# Patient Record
Sex: Female | Born: 1935 | Race: White | Hispanic: No | Smoking: Never smoker
Health system: Southern US, Community
[De-identification: ages and names within clinical notes are randomized; demographics above are authoritative.]

## PROBLEM LIST (undated history)

## (undated) DIAGNOSIS — C801 Malignant (primary) neoplasm, unspecified: Secondary | ICD-10-CM

## (undated) DIAGNOSIS — I10 Essential (primary) hypertension: Secondary | ICD-10-CM

## (undated) HISTORY — PX: CHOLECYSTECTOMY: SHX55

## (undated) HISTORY — PX: CORONARY ARTERY BYPASS GRAFT: SHX141

## (undated) HISTORY — PX: TONSILLECTOMY: SUR1361

## (undated) HISTORY — PX: EYE SURGERY: SHX253

## (undated) HISTORY — PX: THYROID SURGERY: SHX805

---

## 1999-06-04 ENCOUNTER — Inpatient Hospital Stay (HOSPITAL_COMMUNITY): Admission: RE | Admit: 1999-06-04 | Discharge: 1999-06-11 | Payer: Self-pay | Admitting: Cardiology

## 1999-06-05 ENCOUNTER — Encounter: Payer: Self-pay | Admitting: Cardiothoracic Surgery

## 1999-06-06 ENCOUNTER — Encounter: Payer: Self-pay | Admitting: Cardiothoracic Surgery

## 1999-06-07 ENCOUNTER — Encounter: Payer: Self-pay | Admitting: Cardiothoracic Surgery

## 1999-06-08 ENCOUNTER — Encounter: Payer: Self-pay | Admitting: Cardiothoracic Surgery

## 2000-08-06 ENCOUNTER — Ambulatory Visit (HOSPITAL_COMMUNITY): Admission: RE | Admit: 2000-08-06 | Discharge: 2000-08-06 | Payer: Self-pay | Admitting: Cardiology

## 2000-08-06 ENCOUNTER — Encounter: Payer: Self-pay | Admitting: Cardiology

## 2000-12-30 ENCOUNTER — Ambulatory Visit (HOSPITAL_COMMUNITY): Admission: RE | Admit: 2000-12-30 | Discharge: 2000-12-30 | Payer: Self-pay | Admitting: Pulmonary Disease

## 2001-01-31 ENCOUNTER — Ambulatory Visit (HOSPITAL_COMMUNITY): Admission: RE | Admit: 2001-01-31 | Discharge: 2001-01-31 | Payer: Self-pay | Admitting: Cardiology

## 2001-01-31 ENCOUNTER — Encounter: Payer: Self-pay | Admitting: Cardiology

## 2001-04-12 ENCOUNTER — Encounter: Payer: Self-pay | Admitting: *Deleted

## 2001-04-12 ENCOUNTER — Ambulatory Visit (HOSPITAL_COMMUNITY): Admission: RE | Admit: 2001-04-12 | Discharge: 2001-04-13 | Payer: Self-pay | Admitting: *Deleted

## 2001-11-11 ENCOUNTER — Encounter: Payer: Self-pay | Admitting: *Deleted

## 2001-11-11 ENCOUNTER — Ambulatory Visit (HOSPITAL_COMMUNITY): Admission: RE | Admit: 2001-11-11 | Discharge: 2001-11-12 | Payer: Self-pay | Admitting: *Deleted

## 2002-01-20 ENCOUNTER — Observation Stay (HOSPITAL_COMMUNITY): Admission: RE | Admit: 2002-01-20 | Discharge: 2002-01-21 | Payer: Self-pay | Admitting: Orthopedic Surgery

## 2002-03-15 ENCOUNTER — Encounter (HOSPITAL_COMMUNITY): Admission: RE | Admit: 2002-03-15 | Discharge: 2002-04-14 | Payer: Self-pay | Admitting: Orthopedic Surgery

## 2002-04-17 ENCOUNTER — Encounter (HOSPITAL_COMMUNITY): Admission: RE | Admit: 2002-04-17 | Discharge: 2002-05-17 | Payer: Self-pay | Admitting: Orthopedic Surgery

## 2002-05-19 ENCOUNTER — Encounter (HOSPITAL_COMMUNITY): Admission: RE | Admit: 2002-05-19 | Discharge: 2002-06-18 | Payer: Self-pay | Admitting: Orthopedic Surgery

## 2002-06-28 ENCOUNTER — Encounter (HOSPITAL_COMMUNITY): Admission: RE | Admit: 2002-06-28 | Discharge: 2002-07-28 | Payer: Self-pay | Admitting: Orthopedic Surgery

## 2002-07-12 ENCOUNTER — Inpatient Hospital Stay (HOSPITAL_COMMUNITY): Admission: EM | Admit: 2002-07-12 | Discharge: 2002-07-15 | Payer: Self-pay | Admitting: Internal Medicine

## 2002-07-12 ENCOUNTER — Encounter: Payer: Self-pay | Admitting: Emergency Medicine

## 2002-07-12 ENCOUNTER — Emergency Department (HOSPITAL_COMMUNITY): Admission: EM | Admit: 2002-07-12 | Discharge: 2002-07-12 | Payer: Self-pay | Admitting: Emergency Medicine

## 2002-07-13 ENCOUNTER — Encounter: Payer: Self-pay | Admitting: Internal Medicine

## 2002-07-14 ENCOUNTER — Encounter: Payer: Self-pay | Admitting: Cardiology

## 2002-08-03 ENCOUNTER — Ambulatory Visit (HOSPITAL_COMMUNITY): Admission: RE | Admit: 2002-08-03 | Discharge: 2002-08-03 | Payer: Self-pay | Admitting: Cardiology

## 2002-08-03 ENCOUNTER — Encounter: Payer: Self-pay | Admitting: Cardiology

## 2002-10-17 ENCOUNTER — Encounter: Payer: Self-pay | Admitting: Cardiology

## 2002-10-17 ENCOUNTER — Ambulatory Visit (HOSPITAL_COMMUNITY): Admission: RE | Admit: 2002-10-17 | Discharge: 2002-10-17 | Payer: Self-pay | Admitting: Cardiology

## 2003-09-14 ENCOUNTER — Observation Stay (HOSPITAL_COMMUNITY): Admission: RE | Admit: 2003-09-14 | Discharge: 2003-09-15 | Payer: Self-pay | Admitting: Orthopedic Surgery

## 2003-10-29 ENCOUNTER — Encounter (HOSPITAL_COMMUNITY): Admission: RE | Admit: 2003-10-29 | Discharge: 2003-11-28 | Payer: Self-pay | Admitting: Orthopedic Surgery

## 2003-11-29 ENCOUNTER — Encounter (HOSPITAL_COMMUNITY): Admission: RE | Admit: 2003-11-29 | Discharge: 2003-12-07 | Payer: Self-pay | Admitting: Orthopedic Surgery

## 2003-12-06 ENCOUNTER — Ambulatory Visit (HOSPITAL_COMMUNITY): Admission: RE | Admit: 2003-12-06 | Discharge: 2003-12-06 | Payer: Self-pay | Admitting: *Deleted

## 2003-12-11 ENCOUNTER — Encounter (HOSPITAL_COMMUNITY): Admission: RE | Admit: 2003-12-11 | Discharge: 2004-01-10 | Payer: Self-pay | Admitting: Orthopedic Surgery

## 2004-10-21 ENCOUNTER — Ambulatory Visit: Payer: Self-pay | Admitting: *Deleted

## 2004-10-27 ENCOUNTER — Ambulatory Visit: Payer: Self-pay | Admitting: *Deleted

## 2004-10-27 ENCOUNTER — Ambulatory Visit (HOSPITAL_COMMUNITY): Admission: RE | Admit: 2004-10-27 | Discharge: 2004-10-27 | Payer: Self-pay | Admitting: *Deleted

## 2004-11-03 ENCOUNTER — Ambulatory Visit: Payer: Self-pay | Admitting: *Deleted

## 2004-11-18 ENCOUNTER — Ambulatory Visit: Payer: Self-pay

## 2005-01-19 ENCOUNTER — Ambulatory Visit: Payer: Self-pay | Admitting: Cardiology

## 2006-01-15 ENCOUNTER — Ambulatory Visit: Payer: Self-pay | Admitting: Internal Medicine

## 2006-01-19 ENCOUNTER — Ambulatory Visit (HOSPITAL_COMMUNITY): Admission: RE | Admit: 2006-01-19 | Discharge: 2006-01-19 | Payer: Self-pay | Admitting: Internal Medicine

## 2007-01-28 ENCOUNTER — Ambulatory Visit (HOSPITAL_COMMUNITY): Admission: RE | Admit: 2007-01-28 | Discharge: 2007-01-28 | Payer: Self-pay | Admitting: Internal Medicine

## 2007-02-28 ENCOUNTER — Ambulatory Visit: Payer: Self-pay | Admitting: Internal Medicine

## 2008-02-16 ENCOUNTER — Ambulatory Visit: Payer: Self-pay | Admitting: Cardiology

## 2008-02-24 ENCOUNTER — Ambulatory Visit (HOSPITAL_COMMUNITY): Admission: RE | Admit: 2008-02-24 | Discharge: 2008-02-24 | Payer: Self-pay | Admitting: Cardiology

## 2008-05-23 ENCOUNTER — Ambulatory Visit: Payer: Self-pay | Admitting: Cardiology

## 2008-05-23 ENCOUNTER — Ambulatory Visit (HOSPITAL_COMMUNITY): Admission: RE | Admit: 2008-05-23 | Discharge: 2008-05-23 | Payer: Self-pay | Admitting: Cardiology

## 2008-06-06 ENCOUNTER — Ambulatory Visit: Payer: Self-pay | Admitting: Cardiology

## 2009-03-27 ENCOUNTER — Ambulatory Visit (HOSPITAL_COMMUNITY): Admission: RE | Admit: 2009-03-27 | Discharge: 2009-03-27 | Payer: Self-pay | Admitting: Pulmonary Disease

## 2009-04-30 ENCOUNTER — Ambulatory Visit (HOSPITAL_COMMUNITY): Admission: RE | Admit: 2009-04-30 | Discharge: 2009-04-30 | Payer: Self-pay | Admitting: Pulmonary Disease

## 2009-05-22 ENCOUNTER — Ambulatory Visit (HOSPITAL_COMMUNITY): Admission: RE | Admit: 2009-05-22 | Discharge: 2009-05-22 | Payer: Self-pay | Admitting: Pulmonary Disease

## 2009-06-04 ENCOUNTER — Telehealth (INDEPENDENT_AMBULATORY_CARE_PROVIDER_SITE_OTHER): Payer: Self-pay | Admitting: *Deleted

## 2009-06-12 ENCOUNTER — Ambulatory Visit (HOSPITAL_COMMUNITY): Admission: RE | Admit: 2009-06-12 | Discharge: 2009-06-12 | Payer: Self-pay | Admitting: Pulmonary Disease

## 2009-07-18 DIAGNOSIS — Z8669 Personal history of other diseases of the nervous system and sense organs: Secondary | ICD-10-CM

## 2009-07-18 DIAGNOSIS — I447 Left bundle-branch block, unspecified: Secondary | ICD-10-CM | POA: Insufficient documentation

## 2009-07-18 DIAGNOSIS — I2510 Atherosclerotic heart disease of native coronary artery without angina pectoris: Secondary | ICD-10-CM

## 2009-07-18 DIAGNOSIS — I10 Essential (primary) hypertension: Secondary | ICD-10-CM | POA: Insufficient documentation

## 2009-07-18 DIAGNOSIS — I739 Peripheral vascular disease, unspecified: Secondary | ICD-10-CM | POA: Insufficient documentation

## 2009-07-19 ENCOUNTER — Encounter (INDEPENDENT_AMBULATORY_CARE_PROVIDER_SITE_OTHER): Payer: Self-pay | Admitting: *Deleted

## 2009-07-19 ENCOUNTER — Ambulatory Visit: Payer: Self-pay | Admitting: Cardiology

## 2009-07-19 DIAGNOSIS — E785 Hyperlipidemia, unspecified: Secondary | ICD-10-CM

## 2009-07-19 DIAGNOSIS — Z9089 Acquired absence of other organs: Secondary | ICD-10-CM

## 2009-07-19 DIAGNOSIS — I6529 Occlusion and stenosis of unspecified carotid artery: Secondary | ICD-10-CM

## 2009-08-02 ENCOUNTER — Ambulatory Visit (HOSPITAL_COMMUNITY)
Admission: RE | Admit: 2009-08-02 | Discharge: 2009-08-02 | Payer: Self-pay | Source: Home / Self Care | Admitting: Cardiology

## 2009-08-06 ENCOUNTER — Encounter: Payer: Self-pay | Admitting: Cardiology

## 2009-11-13 ENCOUNTER — Telehealth (INDEPENDENT_AMBULATORY_CARE_PROVIDER_SITE_OTHER): Payer: Self-pay

## 2010-03-11 ENCOUNTER — Telehealth (INDEPENDENT_AMBULATORY_CARE_PROVIDER_SITE_OTHER): Payer: Self-pay

## 2010-03-30 ENCOUNTER — Encounter: Payer: Self-pay | Admitting: *Deleted

## 2010-04-08 NOTE — Progress Notes (Signed)
Summary: Colleen Roberts LIPITOR   Phone Note Call from Patient Call back at Home Phone [phone]   Caller: PT Reason for Call: Talk to Nurse Summary of Call: PT HAS QUESTIONS ABOUT GENERIC LIPITOR AND IF IT WILL WORK FOR HER. IF SO SHE WOULD LIKE TO CHANGE TO GENERIC. Initial call taken by: Faythe Ghee,  November 13, 2009 9:30 AM  Follow-up for Phone Call        Pt. advised that the generic for Lipitor is not yet available and that when it does we will refill with generic. She states she understands info. given.  Follow-up by: Larita Fife Via LPN,  November 13, 2009 10:43 AM

## 2010-04-08 NOTE — Letter (Signed)
Summary: [place] Results Engineer, agricultural at [facility]  [address] [place] [address]   Phone: [phone]  Fax: [phone]      Aug 06, 2009 MRN: [id number]   Colleen Roberts [address] [place], [place]  [address]   Dear Ms. Aurther Loft,  Your test ordered by Selena Batten has been reviewed by your physician (or physician assistant) and was found to be normal or stable. Your physician (or physician assistant) felt no changes were needed at this time.  ____ Echocardiogram  ____ Cardiac Stress Test  ____ Lab Work  __X__ Peripheral vascular study of arms, legs or neck  ____ CT scan or X-ray  ____ Lung or Breathing test  ____ Other: Please continue on current medical treatment.  Thank you.   Valera Castle, MD, F.A.C.C

## 2010-04-08 NOTE — Progress Notes (Signed)
Summary: Refills  Medications Added DIOVAN HCT 320-25 MG TABS (VALSARTAN-HYDROCHLOROTHIAZIDE) Take 1 tablet by mouth once a day AMLODIPINE BESYLATE 10 MG TABS (AMLODIPINE BESYLATE) Take one tablet by mouth daily LIPITOR 20 MG TABS (ATORVASTATIN CALCIUM) Take one tablet by mouth daily.       Phone Note Call from Patient   Caller: Patient Reason for Call: Refill Medication Summary of Call: pt needs refill for medications/she states she gets them through mail order/would like return phone call to tell which ones she needs/tg Initial call taken by: Raechel Ache [facility],  June 04, 2009 9:04 AM    New/Updated Medications: DIOVAN HCT 320-25 MG TABS (VALSARTAN-HYDROCHLOROTHIAZIDE) Take 1 tablet by mouth once a day AMLODIPINE BESYLATE 10 MG TABS (AMLODIPINE BESYLATE) Take one tablet by mouth daily LIPITOR 20 MG TABS (ATORVASTATIN CALCIUM) Take one tablet by mouth daily. Prescriptions: LIPITOR 20 MG TABS (ATORVASTATIN CALCIUM) Take one tablet by mouth daily.  #90 x 0   Entered by:   Teressa Lower RN   Authorized by:   Gaylord Shih, MD, [facility]   Signed by:   Teressa Lower RN on 06/05/2009   Method used:   Faxed to ...       [facility]order)             , [place]         Ph:        Fax: [phone]   RxID:   [id number] AMLODIPINE BESYLATE 10 MG TABS (AMLODIPINE BESYLATE) Take one tablet by mouth daily  #90 x 0   Entered by:   Teressa Lower RN   Authorized by:   Gaylord Shih, MD, [facility]   Signed by:   Teressa Lower RN on 06/05/2009   Method used:   Faxed to ...       [facility] (mail[facility]order)             , [place]         Ph:        Fax: [phone]   RxID:   [id number] DIOVAN HCT 320-25 MG TABS (VALSARTAN-HYDROCHLOROTHIAZIDE) Take 1 tablet by mouth once a day  #90 x 0   Entered by:   Teressa Lower RN   Authorized by:   Gaylord Shih, MD, [facility]   Signed by:   Teressa Lower RN on 06/05/2009   Method used:   Faxed to  ...       [facility] (mail-order)             , [place]         Ph:        Fax: [phone]   RxID:   [id number]

## 2010-04-08 NOTE — Letter (Signed)
Summary: [place] Future Lab Work Engineer, agricultural at [employer]  [address], [place] [address]   Phone: [phone]  Fax: [phone]     Jul 19, 2009 MRN: [id number]   BERLENE DIXSON [address] [place], [place]  [address]      YOUR LAB WORK IS DUE  August 19, 2009 _________________________________________  Please go to [facility], located across the street from [facility] on the second floor.  Hours are Monday - Friday 7am until 7:30pm         Saturday 8am until 12noon    _x_  DO NOT EAT OR DRINK AFTER MIDNIGHT EVENING PRIOR TO LABWORK  __ YOUR LABWORK IS NOT FASTING --YOU MAY EAT PRIOR TO LABWORK

## 2010-04-08 NOTE — Assessment & Plan Note (Signed)
Summary: ROV  Medications Added ASPIR-TRIN 325 MG TBEC (ASPIRIN) take 1`tab daily VITAMIN B-12 1000 MCG TABS (CYANOCOBALAMIN) take 1 tab daily DAILY MULTI  TABS (MULTIPLE VITAMINS-MINERALS) take 1 tab daily MAGNESIUM OXIDE 500 MG TABS (MAGNESIUM OXIDE) take 1 tab daily      Allergies Added: ! MORPHINE  Visit Type:  Follow-up Primary Provider:  Nehemiah Settle  CC:  no cardiology complaints.  History of Present Illness: Colleen Roberts returns today for evaluation management of her coronary artery disease, history of prior bypass surgery, history of peripheral vascular disease with a stent in her right leg, hypertension, hyperlipidemia.  She's having no angina or ischemic symptoms. She denies any claudication. She's had no symptoms of TIAs or mini stroke.  She has not had blood work in quite some time. She seems to be compliant with her medications. Her daughter is with her today.  She denies orthopnea, PND but has had some mild edema in the right lower extremity. She has had varicose veins since age 78 she had her legs wrapped for boils.  Current Medications (verified): 1)  Diovan Hct 320-25 Mg Tabs (Valsartan-Hydrochlorothiazide) .... Take 1 Tablet By Mouth Once A Day 2)  Amlodipine Besylate 10 Mg Tabs (Amlodipine Besylate) .... Take One Tablet By Mouth Daily 3)  Lipitor 20 Mg Tabs (Atorvastatin Calcium) .... Take One Tablet By Mouth Daily. 4)  Aspir-Trin 325 Mg Tbec (Aspirin) .... Take 1`tab Daily 5)  Vitamin B-12 1000 Mcg Tabs (Cyanocobalamin) .... Take 1 Tab Daily 6)  Daily Multi  Tabs (Multiple Vitamins-Minerals) .... Take 1 Tab Daily 7)  Magnesium Oxide 500 Mg Tabs (Magnesium Oxide) .... Take 1 Tab Daily  Allergies (verified): 1)  ! Morphine  Past History:  Past Medical History: Last updated: 07/18/2009 Current Problems:  HYPERTENSION (ICD-401.9) PVD (ICD-443.9) BUNDLE BRANCH BLOCK, LEFT (ICD-426.3) CAD (ICD-414.00) SYNCOPE, HX OF (ICD-V12.49)  Past Surgical  History: Last updated: 07/18/2009 cath coronary artery bypass graft  skin cancer cholecystectomy  Social History: Last updated: 07/18/2009 Retired  Tobacco Use - No.  Alcohol Use - no Regular Exercise - no Drug Use - no  Risk Factors: Exercise: no (07/18/2009)  Risk Factors: Smoking Status: never (07/18/2009)  Review of Systems       negative other than history of present illness  Vital Signs:  Patient profile:   77 year old female Height:      60 inches Weight:      118 pounds BMI:     23.13 Pulse rate:   90 / minute BP sitting:   141 / 63  (right arm)  Vitals Entered By: Colleen Saa, CNA (Jul 19, 2009 3:53 PM)  Physical Exam  General:  no acute distress, looks out of his stated age Head:  normocephalic and atraumatic Eyes:  PERRLA/EOM intact; conjunctiva and lids normal. Neck:  Neck supple, no JVD. No masses, thyromegaly or abnormal cervical nodes. Chest Deep [place]:  no deformities or breast masses noted Lungs:  Clear bilaterally to auscultation and percussion. Heart:  PMI nondisplaced, regular rate and rhythm, soft systolic murmur, no gallop, bilateral carotid bruits right greater than left Msk:  decreased ROM.  decreased ROM.   Pulses:  pulses normal in all 4 extremities Extremities:  trace right pedal edema.  trace right pedal edema.   Neurologic:  Alert and oriented x 3. Skin:  Intact without lesions or rashes. Psych:  Normal affect.   Problems:  Medical Problems Added: 1)  Dx of Hyperlipidemia-mixed  (ICD-272.4) 2)  Dx of Thyroidectomy,  Hx of  (ICD-V45.79) 3)  Dx of Hyperlipidemia-mixed  (ICD-272.4) 4)  Dx of Carotid Artery Stenosis, Without Infarction  (ICD-433.10)  Impression & Recommendations:  Problem # 1:  CAD (ICD-414.00) Assessment Unchanged  Her updated medication list for this problem includes:    Amlodipine Besylate 10 Mg Tabs (Amlodipine besylate) .Marland Kitchen... Take one tablet by mouth daily    Aspir-trin 325 Mg Tbec (Aspirin) .Marland Kitchen... Take 1`tab  daily  Her updated medication list for this problem includes:    Amlodipine Besylate 10 Mg Tabs (Amlodipine besylate) .Marland Kitchen... Take one tablet by mouth daily    Aspir-trin 325 Mg Tbec (Aspirin) .Marland Kitchen... Take 1`tab daily  Orders: Carotid Duplex (Carotid Duplex)Future Orders: T-Lipid Profile ([id number]) ... 08/19/2009 T-Comprehensive Metabolic Panel [id number]) ... 08/19/2009  Problem # 2:  BUNDLE BRANCH BLOCK, LEFT (ICD-426.3)  Her updated medication list for this problem includes:    Amlodipine Besylate 10 Mg Tabs (Amlodipine besylate) .Marland Kitchen... Take one tablet by mouth daily    Aspir-trin 325 Mg Tbec (Aspirin) .Marland Kitchen... Take 1`tab daily  Her updated medication list for this problem includes:    Amlodipine Besylate 10 Mg Tabs (Amlodipine besylate) .Marland Kitchen... Take one tablet by mouth daily    Aspir-trin 325 Mg Tbec (Aspirin) .Marland Kitchen... Take 1`tab daily  Problem # 3:  HYPERTENSION (ICD-401.9) Assessment: Improved  Her updated medication list for this problem includes:    Diovan Hct 320-25 Mg Tabs (Valsartan-hydrochlorothiazide) .Marland Kitchen... Take 1 tablet by mouth once a day    Amlodipine Besylate 10 Mg Tabs (Amlodipine besylate) .Marland Kitchen... Take one tablet by mouth daily    Aspir-trin 325 Mg Tbec (Aspirin) .Marland Kitchen... Take 1`tab daily  Her updated medication list for this problem includes:    Diovan Hct 320-25 Mg Tabs (Valsartan-hydrochlorothiazide) .Marland Kitchen... Take 1 tablet by mouth once a day    Amlodipine Besylate 10 Mg Tabs (Amlodipine besylate) .Marland Kitchen... Take one tablet by mouth daily    Aspir-trin 325 Mg Tbec (Aspirin) .Marland Kitchen... Take 1`tab daily  Future Orders: T-Lipid Profile ([id number]) ... 08/19/2009 T-Comprehensive Metabolic Panel [id number]) ... 08/19/2009  Problem # 4:  PVD (ICD-443.9) Assessment: Unchanged  Problem # 5:  CAROTID ARTERY STENOSIS, WITHOUT INFARCTION (ICD-433.10) Assessment: New I am concerned about a right carotid bruit. There is a soft one perhaps on the left. We'll obtain carotid Dopplers. Her  updated medication list for this problem includes:    Aspir-trin 325 Mg Tbec (Aspirin) .Marland Kitchen... Take 1`tab daily  Her updated medication list for this problem includes:    Aspir-trin 325 Mg Tbec (Aspirin) .Marland Kitchen... Take 1`tab daily  Problem # 6:  HYPERLIPIDEMIA-MIXED (ICD-272.4)  She states she has not had her blood drawn in some time for cholesterol. We'll obtain fasting lipid panel when she returns. Her updated medication list for this problem includes:    Lipitor 20 Mg Tabs (Atorvastatin calcium) .Marland Kitchen... Take one tablet by mouth daily.  Her updated medication list for this problem includes:    Lipitor 20 Mg Tabs (Atorvastatin calcium) .Marland Kitchen... Take one tablet by mouth daily.  Other Orders: Future Orders: T-TSH ([id number]) ... 08/19/2009  Patient Instructions: 1)  Your physician recommends that you schedule a follow-up appointment in: 1 year 2)  Your physician recommends that you return for lab work in: 1 month 3)  Your physician has requested that you have a carotid duplex. This test is an ultrasound of the carotid arteries in your neck. It looks at blood flow through these arteries that supply the brain with blood. Allow one hour for this exam. There  are no restrictions or special instructions.

## 2010-04-10 NOTE — Progress Notes (Signed)
**Note De-Identified Mylz Yuan Obfuscation** Summary: Refill  Medications Added LIPITOR 20 MG TABS (ATORVASTATIN CALCIUM) Take one tablet by mouth daily. LIPITOR 20 MG TABS (ATORVASTATIN CALCIUM) Take one tablet by mouth daily.       Phone Note Call from Patient   Caller: Patient Reason for Call: Talk to Nurse Summary of Call: pt would like generic for Lipitor sent to [facility] Initial call taken by: Raechel Ache [facility],  March 11, 2010 9:11 AM    New/Updated Medications: LIPITOR 20 MG TABS (ATORVASTATIN CALCIUM) Take one tablet by mouth daily. LIPITOR 20 MG TABS (ATORVASTATIN CALCIUM) Take one tablet by mouth daily. Prescriptions: LIPITOR 20 MG TABS (ATORVASTATIN CALCIUM) Take one tablet by mouth daily.  #90 x 0   Entered by:   Larita Fife Carlicia Leavens LPN   Authorized by:   Joni Reining, NP   Signed by:   Larita Fife Chey Cho LPN on 16/12/9602   Method used:   Faxed to ...       [facility] (retail)             , [place]         Ph: [phone]       Fax: [phone]   RxID:   [id number] LIPITOR 20 MG TABS (ATORVASTATIN CALCIUM) Take one tablet by mouth daily.  #90 x 0   Entered by:   Larita Fife Aerion Bagdasarian LPN   Authorized by:   Joni Reining, NP   Signed by:   Larita Fife Fortune Brannigan LPN on 32/44/0102   Method used:   Electronically to        [facility] (retail)       [address]       [place], [place]  [address]       Ph: [phone]       Fax: [phone]   RxID:   [id number]  RX faxed to [facility] in error, phar. notified.

## 2010-06-16 ENCOUNTER — Telehealth: Payer: Self-pay | Admitting: Cardiology

## 2010-06-16 MED ORDER — AMLODIPINE BESYLATE 10 MG PO TABS: 10.0000 mg | ORAL_TABLET | Freq: Every day | ORAL | Status: DC

## 2010-06-16 MED ORDER — VALSARTAN-HYDROCHLOROTHIAZIDE 320-25 MG PO TABS: 1.0000 | ORAL_TABLET | Freq: Every day | ORAL | Status: DC

## 2010-06-16 MED ORDER — ATORVASTATIN CALCIUM 20 MG PO TABS: 20.0000 mg | ORAL_TABLET | Freq: Every day | ORAL | Status: DC

## 2010-06-16 NOTE — Telephone Encounter (Signed)
Patient needs refills for Atorvastatin, Diovan and Amlodopine called to [facility]

## 2010-06-23 ENCOUNTER — Telehealth: Payer: Self-pay | Admitting: Cardiology

## 2010-06-23 NOTE — Telephone Encounter (Signed)
PT NEEDS NEW RX CALLED IN [facility]. SHE WAS TOLD LAST WEEK THAT IT HAD BEEN SENT IN BUT THE PHARMACY HAS NEVER RECEIVED IT.

## 2010-06-24 ENCOUNTER — Other Ambulatory Visit (HOSPITAL_COMMUNITY): Payer: Self-pay | Admitting: Orthopaedic Surgery

## 2010-06-24 DIAGNOSIS — S32040A Wedge compression fracture of fourth lumbar vertebra, initial encounter for closed fracture: Secondary | ICD-10-CM

## 2010-06-24 DIAGNOSIS — S32050A Wedge compression fracture of fifth lumbar vertebra, initial encounter for closed fracture: Secondary | ICD-10-CM

## 2010-06-27 ENCOUNTER — Encounter (HOSPITAL_COMMUNITY)
Admission: RE | Admit: 2010-06-27 | Discharge: 2010-06-27 | Disposition: A | Discharge status: Home / Self Care | Payer: Medicare Other | Source: Ambulatory Visit | Attending: Orthopaedic Surgery | Admitting: Orthopaedic Surgery

## 2010-06-27 ENCOUNTER — Encounter (HOSPITAL_COMMUNITY): Payer: Medicare Other

## 2010-06-27 ENCOUNTER — Encounter (HOSPITAL_COMMUNITY): Payer: Self-pay

## 2010-06-27 DIAGNOSIS — M545 Low back pain, unspecified: Secondary | ICD-10-CM | POA: Insufficient documentation

## 2010-06-27 DIAGNOSIS — R937 Abnormal findings on diagnostic imaging of other parts of musculoskeletal system: Secondary | ICD-10-CM | POA: Insufficient documentation

## 2010-06-27 DIAGNOSIS — S32040A Wedge compression fracture of fourth lumbar vertebra, initial encounter for closed fracture: Secondary | ICD-10-CM

## 2010-06-27 DIAGNOSIS — S32050A Wedge compression fracture of fifth lumbar vertebra, initial encounter for closed fracture: Secondary | ICD-10-CM

## 2010-06-27 HISTORY — DX: Malignant (primary) neoplasm, unspecified: C80.1

## 2010-06-27 MED ORDER — TECHNETIUM TC 99M MEDRONATE IV KIT
25.0000 | PACK | Freq: Once | INTRAVENOUS | Status: AC | PRN
  Administered 2010-06-27: 25 via INTRAVENOUS

## 2010-07-22 NOTE — Assessment & Plan Note (Signed)
[facility]                       [place] CARDIOLOGY OFFICE NOTE   GEORGIANNE, GRITZ                       MRN:          [id number]  DATE:05/23/2008                            DOB:          Jul 31, 1918    Ms. Slimp comes in today for followup.  Unfortunately, she has developed  a viral respiratory infection over the last week or so.  She has been  coughing quite a bit and she had a vasovagal event with syncope this  past week.  There were no associated cardiac symptoms prior to this, nor  any residual symptoms of a stroke or seizure.   She denies any fever, chills, or sweats.  She has not been coughing up  any blood.  She does cough up a little bit of yellow sputum.   She does not have a history of COPD and does not smoke.   On her last visit, we repeated carotid Dopplers, which showed increased  velocities and antegrade flow in both vertebrals.  Please refer to that  procedure note on February 24, 2008.  Her blood pressure was also  elevated on last visit at 160.   We changed her Caduet to amlodipine 10 mg per day and Lipitor 20 mg per  day.  She is on Diovan HCTZ 160/25 daily.  She takes aspirin 325 mg per  day.   She denies orthopnea, PND, or peripheral edema.  She has had no angina  or ischemic symptoms.  She denies any tachy palpitations.   Her blood pressure today is 170/68, her pulse is 70 and regular.  Weight  is 118, stable.  She looks acutely ill.  Respirations 20.  She has  audible wheezing.  Skin is slightly suffused.  Neck is supple.  There is  no lymphadenopathy.  Carotids are full with bilateral bruits.  Thyroid  is not enlarged.  Trachea is midline.  Chest exam reveals no dullness to  percussion.  She has expiratory wheezing.  No rales.  Heart reveals a  regular rate and rhythm.  No gallop.  Abdominal exam is soft and good  bowel sounds.  Extremities reveals no edema.  Pulses are present, but  reduced.  Neuro exam is  intact.   ASSESSMENT:  1. Uncontrolled hypertension.  2. Viral upper respiratory infection with significant bronchospasm.   PLAN:  1. Increase Diovan HCTZ to 320/25 daily.  2. Levaquin 500 mg a day for 7 days.  3. Albuterol inhaler p.r.n.  4. Chest x-ray today.   I have made her daughter aware of her carotid studies.  We will see her  back closely for followup in 2 weeks.   Please note, she did not call Dr. Juanetta Gosling' office about the URI.  I told  her I normally do not treat this, but was worried about her today.     Thomas C. Daleen Squibb, MD, [facility]  Electronically Signed    TCW/MedQ  DD: 05/23/2008  DT: 05/23/2008  Job #: [id number]   cc:   Ramon Dredge L. Juanetta Gosling, M.D.

## 2010-07-22 NOTE — Assessment & Plan Note (Signed)
[facility]                       [place] CARDIOLOGY OFFICE NOTE   PATRA, GHERARDI                       MRN:          [id number]  DATE:02/28/2007                            DOB:          08-13-1918    IDENTIFICATION:  Ms. Ventola is an 77 year old woman who I last saw back  in November of 2007.  She has a history of CAD, status post CABG in  2001, had a Myoview in 2006 with normal perfusion.  The patient also has  a history of peripheral vascular disease, status post prior to admission  of the right femoral artery in 2003; cerebrovascular disease,  hypertension, DJD.  Since seen she has been doing okay.  She denies  chest pain, no shortness of breath, she stays active.   CURRENT MEDICATIONS:  1. Aspirin 325 mg daily.  2. Diovan/hydrochlorothiazide 160/25.  3. Caduet 10/20 q.h.s.  4. Prilosec 20 OTC daily.   REVIEW OF SYSTEMS:  Note that she sleeps not well.  She falls asleep  well but wakes up at about 3 hours having to urinate and urinates a  couple of times per night.  She says she has about 8 ounces of water at  dinner.   PHYSICAL EXAMINATION:  GENERAL APPEARANCE:  On exam the patient is in no  distress.  VITAL SIGNS:  Blood pressure is 140/78. Pulse is 66.  Weight is 118.  NECK:  JVP is normal.  LUNGS:  Clear.  CARDIAC:  Exam shows regular rate and rhythm, S1, S2, no S3, no  significant murmurs.  ABDOMEN:  Benign.  EXTREMITIES:  No edema.   CLINICAL DATA:  Carotid ultrasound shows bilateral internal carotid  artery stenoses of 50 to 69%.  Slight progression noted on the right.  Otherwise no significant change.   IMPRESSION:  1. Coronary artery disease (status post coronary artery bypass      grafting x4; left internal mammary artery to left anterior      descending; saphenous vein graft to [facility]; saphenous vein graft to      obtuse marginal, saphenous vein graft to distal right coronary      artery).  She is doing well  clinically.  Would continue.  2. Cerebrovascular disease.  Will continue with periodic followup.  3. Peripheral vascular disease.  Denies significant claudication.  4. Dyslipidemia.  Will set for lipids in February.  5. Hypertension, fair control.  6. Sleeping problems.  I encouraged her to drink less at dinner.  Also      she could try taking her Caduet in the morning and see if that      changes anything.  Again, will have to follow up lipids      to see how her Caduet works as an A.M. dosing.  7. Otherwise I will set followup for August, sooner if problems      develop.     Pricilla Riffle, MD, [facility]  Electronically Signed    PVR/MedQ  DD: 02/28/2007  DT: 03/01/2007  Job #: [id number]   cc:   Ramon Dredge L. Juanetta Gosling, M.D.

## 2010-07-22 NOTE — Assessment & Plan Note (Signed)
[facility]                        CARDIOLOGY OFFICE NOTE   ARLENA, MARSAN                       MRN:          [id number]  DATE:02/16/2008                            DOB:          [birth year]    Ms. Carder comes in today for followup.  I saw her initially when she was  77 years of age!  Since that time she says I had five surgeries.   She saw Dr. Tenny Craw last on February 28, 2007.   PROBLEM LIST:  1. Coronary artery disease status post coronary bypass grafting x4.      Please see previous note for details.  2. Normal left ventricular systolic function.  3. Hyperlipidemia.  4. Peripheral vascular disease status post femoral artery stents in      2003.  5. Hypertension  6. Cerebrovascular disease   She is currently having no angina or ischemic symptoms.  She is able to  walk up and down to get her mail and also enjoys walking at [place]  with a cart.   She denies any claudication.  She does have some cramps in her right leg  at night.   She denies any orthopnea, PND, peripheral edema, tachy palpitations,  syncope, or presyncope.  She has had no symptoms of TIAs.   MEDICATIONS:  1. Enteric-coated aspirin 325 mg a day.  2. Diovan/HCTZ 160/25 daily.  3. Caduet 10/20 nightly.  4. Prilosec 20 mg daily.  5. Magnesium over-the-counter.   PHYSICAL EXAMINATION:  VITAL SIGNS:  Her blood pressure is 160/60; it  usually runs around 140.  Her pulse is 72 and regular.  Her weight is  117.  GENERAL:  She looks much younger than stated age.  HEENT:  Unremarkable except for arcus senilis.  NECK:  Supple.  There is a right carotid bruit, that I think is new.  Thyroid is not enlarged.  Trachea is midline.  LUNGS:  Clear to auscultation and percussion.  HEART:  Nondisplaced PMI, soft S1 and S2.  ABDOMINAL:  Soft, good bowel sounds.  No midline bruit.  EXTREMITIES:  No cyanosis, clubbing, or edema.  She has some varicose  veins.  There is no  sign of DVT.  Her dorsalis pedis pulse on the right  is 1+, absent posterior tibial on the left is 1 to 2+ both dorsalis  pedis and posterior tibial.  NEURO:  Grossly intact.   ASSESSMENT AND PLAN:  Ms. Servantes is doing remarkably well clinically.  I  am concerned about this right carotid bruit.  She is also due blood  work.   PLAN:  1. Carotid Doppler.  2. Fasting comprehensive metabolic panel, lipids and TSH.  3. Continue current medications except we are going to change her      Caduet to amlodipine and Lipitor to save her some money.  4. See me back in 3 months.     Thomas C. Daleen Squibb, MD, [facility]  Electronically Signed    TCW/MedQ  DD: 02/16/2008  DT: 02/16/2008  Job #: [id number]

## 2010-07-22 NOTE — Assessment & Plan Note (Signed)
[facility]                       [name] CARDIOLOGY OFFICE NOTE   Colleen Roberts, Colleen Roberts                       MRN:          [id number]  DATE:06/06/2008                            DOB:          1918-05-31    Colleen Roberts comes in today for followup.  I saw her on May 23, 2008,  when she had a viral URI with severe bronchospasm and cough.   We put her on Levaquin 500 mg a day.  I put her on albuterol inhaler  p.r.n. and she has gotten remarkably better.  A chest x-ray that day  showed no acute abnormalities.   We have been trying to get her blood pressure down as well.  Today, is  140/58, her pulse is 72 and regular.  Weight is stable.  She is in no  acute distress.  There is no JVD.  Lungs are clear without rhonchi or  wheezes.  Heart revealed a regular rate and rhythm.  Soft S1 and S2.  Abdominal exam is soft.  Extremities revealed no edema.  Pulses are  intact.   I am delighted that Colleen Roberts is better.  We have made no other changes  in her medical program.  I will plan on seeing her back again in 6  months.   Note, her Diovan has and will continue to be at the dose of 320 mg/25 mg  of hydrochlorothiazide.  She is also on amlodipine 10 mg per day,  aspirin 325 a day, and Lipitor 20 mg a day.     Thomas C. Daleen Squibb, MD, [facility]  Electronically Signed    TCW/MedQ  DD: 06/06/2008  DT: 06/06/2008  Job #: [id number]

## 2010-07-25 NOTE — Cardiovascular Report (Signed)
   NAME:  [name], [name]                          ACCOUNT NO.:  [id number]   MEDICAL RECORD NO.:  [id number]                   PATIENT TYPE:  EMS   LOCATION:  ED                                   FACILITY:  [facility]   PHYSICIAN:  Salvadore Farber, M.D.             DATE OF BIRTH:  [birth year]   DATE OF PROCEDURE:  DATE OF DISCHARGE:  07/12/2002                              CARDIAC CATHETERIZATION   ADDENDUM:   COMPLICATIONS:  None.   FINDINGS:  1. LV:  173/8/20.  EF 60% without regional wall motion abnormality.  2. Left main:  Angiographically normal.  3. LAD:   Dictation ended at this point.                                               Salvadore Farber, M.D.    WED/MEDQ  D:  10/31/2002  T:  10/31/2002  Job:  [id number]

## 2010-07-25 NOTE — Cardiovascular Report (Signed)
[place]. [facility]  Patient:    [name], Colleen Roberts                       MRN: [id number] Proc. Date: 06/03/99 Adm. Date:  [id number] Attending:  Mirian Mo CC:         Cardiac Catheterization Laboratory             Kari Baars, M.D.             Thomas C. Wall, M.D. [facility]                        Cardiac Catheterization  PROCEDURE PERFORMED:  Left heart catheterization with coronary angiography and eft ventriculography.  CARDIOLOGIST:  Daisey Must, M.D.  INDICATIONS:  [name] is a 77 year old woman with a recent onset of exertional angina.  DESCRIPTION OF PROCEDURE:  A 6-French sheath was placed in the right femoral artery.  We used a 6-French JL-3.5, a 6-French JR4, and a 6-French angled pigtail catheters.  Contrast is Omnipaque.  At the conclusion of the procedure a Perclose vascular closure device was placed in the right femoral artery with good hemostasis.  There are no complications.  RESULTS: HEMODYNAMICS: Left ventricular pressure:  182/25. Aortic pressure:  190/74.  There is no aortic valve gradient.  LEFT VENTRICULOGRAM:  Wall motion is normal.  The ejection fraction is calculated at 60%.  There is trace mitral regurgitation.  LEFT SUBCLAVIAN ARTERY:  Has diffuse mild plaquing in the proximal vessel approximately 20% stenosis.  The left internal mammary artery is patent.  ABDOMINAL AORTOGRAM:  Reveals a 25% stenosis in the origin of the right renal artery, a 60% stenosis in the ostium of the left renal artery.  There is moderate diffuse atherosclerotic disease of the infrarenal abdominal aorta.  There is mild bilateral iliac disease.  CORONARY ARTERIOGRAPHY:  (Right dominant): 1. Left main coronary artery:  Has a distal 80% stenosis.  The left main is    of small caliber and there is significant damping of pressure wave form    with catheter engagement. 2. Left anterior descending coronary artery:  The left anterior  descending has    a tubular 95% stenosis, starting in the proximal vessel, and extending into    the midvessel, across the origin of a large first diagonal branch.  The    first diagonal branch itself has Colleen 80% stenosis at its origin.  The    mid-LAD has a 25% stenosis further down the vessel. 3. Left circumflex coronary artery:  The left circumflex has a 30% stenosis    at its origin.  It gives rise to a large branching OM-I, a small OM-II, and    a small OM-III, and a normal-sized left posterolateral branch. 4. Right coronary artery:  The right coronary artery has a 75% stenosis at    its ostium and a 75% stenosis in the proximal vessel.  The midvessel    has a 60% stenosis, followed by a 50% stenosis in the distal portion of    the midvessel.  The right coronary artery gives rise to a normal-sized    posterior descending artery and a small posterolateral branch.  IMPRESSION: 1. Preserved left ventricular systolic function. 2. Significant three-vessel coronary artery disease as described, including    significant left main and critical left anterior descending coronary artery    disease. 3. Moderate aortic atherosclerotic disease.  PLAN:  The  patient will be started on heparin and nitroglycerin. Cardiovascular surgery will be consulted. DD:  06/03/99 TD:  06/04/99 Job: [id number]

## 2010-07-25 NOTE — Cardiovascular Report (Signed)
NAME:  Colleen Roberts, GLEED NO.:  [id number]   MEDICAL RECORD NO.:  [id number]                   PATIENT TYPE:  INP   LOCATION:                                       FACILITY:  [facility]   PHYSICIAN:  Salvadore Farber, M.D.             DATE OF BIRTH:  Nov 26, 1918   DATE OF PROCEDURE:  07/13/2002  DATE OF DISCHARGE:  07/15/2002                              CARDIAC CATHETERIZATION   PROCEDURE:  Left heart catheterization, left ventriculography, coronary and  bypass graft angiography.   INDICATIONS:  Mr. Femia is an 77 year old gentleman with prior coronary  artery bypass grafting who now presents with syncope.  There was no  accompanying chest discomfort.  He is referred for diagnostic angiography as  a prelude to further EP testing.   PROCEDURAL TECHNIQUE:  Informed consent was obtained.  Under 1% lidocaine  local anesthesia a 6-French sheath was placed in the right femoral artery  using the modified Seldinger technique.  Diagnostic angiography was  performed using JL4 and JR4 catheters for the native catheters, JR4 for the  vein graft, JR4 catheter for the left subclavian, and a LIMA catheter for  the left internal mammary artery.  Ventriculography was performed using a  pigtail catheter.  The patient tolerated the procedure well and was  transferred to the holding room in stable condition.  Sheaths are to be  removed there.   COMPLICATIONS:  None.   FINDINGS:  1. LV 173/8/20.  EF 60% without regional wall motion abnormality.  2. Left main:  90% ostial stenosis.  3. LAD:  Moderate sized vessel giving rise to a single large diagonal     branch.  The proximal LAD has diffuse 80% stenosis crossing the takeoff     of the first diagonal branch.  The LIMA to the mid LAD is widely patent     with excellent distal runoff.  The saphenous vein graft to the diagonal     branch is widely patent.  There is an 80% stenosis in a diagonal graft     just downstream of  the touchdown of this vein graft.  The vessel is     approximately 1 mm in diameter distal to the segment of stenosis.  4. Circumflex:  Relatively large vessel giving rise to three obtuse marginal     branches.  The vessels are relatively small, suggesting diffuse disease.     However, there are no distinct stenoses.  The saphenous vein graft to the     first obtuse marginal is widely patent with excellent distal runoff.  5. RCA:  Severely and diffusely diseased throughout its proximal and mid     course with 80-90% stenosis.  The saphenous vein graft to the distal RCA     is widely patent with excellent distal runoff.  6. Widely patent left subclavian artery.    IMPRESSION/RECOMMENDATIONS:  The patient has widely  patent bypass graft.  Only potential territory of ischemia is of the diagonal which is too small  to make percutaneous revascularization a reasonable option.  Recommend  continued medical therapy for his coronary disease while proceeding with  further evaluation of his syncope.                                               Salvadore Farber, M.D.    WED/MEDQ  D:  11/08/2002  T:  11/08/2002  Job:  [id number]   cc:   Ramon Dredge L. Juanetta Gosling, M.D.  [address]  [place]  [place] [address]  Fax: [phone]

## 2010-07-25 NOTE — Discharge Summary (Signed)
[place]. [facility]  Patient:    Colleen Roberts, Colleen Roberts                       MRN: [id number] Adm. Date:  [id number] Disc. Date: [id number] Attending:  Waldo Laine Dictator:   Lissa Merlin, P.A.-C. CC:         Gwenith Daily. Tyrone Sage, M.D.             Thomas C. Wall, M.D. [facility]                           Discharge Summary  DATE OF BIRTH:  14-May-1918  ADDENDUM  The patient was doing well today.  Nursing noted brief episode of ventricular tachycardia the previous night.  It was asymptomatic.  The patient was stable.  This morning the patient was evaluated during morning rounds for possible discharge.  After noting the rhythm last night, it was decided to adjust her medications and check her lab work.  Potassium and magnesium level were drawn.  Potassium was 4.5, magnesium was 2.6.  The patient has remained in normal sinus  rhythm.  Her Lopressor was increased from 25 mg q.12h. to 50 mg in a.m. and 25 g in p.m.  She also had an adjustment with her pain medication.  Ultram was discontinued and she was started on Darvocet 1-2 p.o. q.6h. p.r.n.  Dr. Tyrone Sage and [facility] were both consulted before her discharge.  She was deemed suitable and stable for discharge home.  Her daughter is a Engineer, civil (consulting) and is going to check her blood pressure periodically at home and report any abnormal results to the office.  Her follow-up appointments remain as scheduled before.  She is to see Dr. Daleen Squibb,  April 18, at 12 noon.  The office will call her with the appointment to see Dr.  Tyrone Sage in approximately three weeks. DD:  06/11/99 TD:  06/13/99 Job: [id number]

## 2010-07-25 NOTE — Op Note (Signed)
NAME:  Colleen Roberts, Colleen Roberts                          ACCOUNT NO.:  [id number]   MEDICAL RECORD NO.:  [id number]                   PATIENT TYPE:  OBV   LOCATION:  0474                                 FACILITY:  [facility]   PHYSICIAN:  Georges Lynch. Darrelyn Hillock, M.D.             DATE OF BIRTH:  03/11/1918   DATE OF PROCEDURE:  09/14/2003  DATE OF DISCHARGE:                                 OPERATIVE REPORT   SURGEON:  Georges Lynch. Darrelyn Hillock, M.D.   ASSISTANT:  Ebbie Ridge. Paitsel, P.A.   PREOPERATIVE DIAGNOSIS:  Complete retracted tear of the rotator cuff tendon  on the left.   POSTOPERATIVE DIAGNOSIS:  Complete retracted tear of the rotator cuff tendon  on the left.   OPERATIONS:  1. Partial acromionectomy and acromioplasty.  2. Repair of a retracted tear of the rotator cuff by advancing the cuff     forward then suturing it in place with 2 multi-tack sutures.  The bone     was extremely soft.  I then utilized a TissueMend graft to reinforce the     repair.   DESCRIPTION OF PROCEDURE:  Under general anesthesia, a routine orthopedic  prep and draping of the left shoulder was carried out.  The patient had 1 g  of IV Ancef.  At this time, a small incision was made over the anterior  aspect of the left shoulder.  Bleeders identified and cauterized.  I then  inserted the self-retaining retractors.  I detached the deltoid tendon by  sharp dissection and then excised the subdeltoid bursa which was chronically  inflamed.  She had a large hole in her rotator cuff which was partially  retracted, and we had some attachment on both sides, but the majority of it  was retracted.  This is all secondary to severe impingement.  I protected  the tendon, inserted a Bennett retractor and did a partial acromionectomy  with the oscillating saw and then utilized the bur to even out the rough  ends of the bone.  I then burred down the lateral articular surface of the  humerus and following that, I then inserted a few multi-tack  sutures.  I  tried to put 3 multi-tacks in, but the bone was extremely soft, and the  suture pulled out.  So I was able to get 2 good purchased multi-tack sutures  in.  I advanced the tendon forward, sutured it down in place, and reinforced  it with a TissueMend graft.  I thoroughly irrigated out the area,  reapproximated the deltoid tendon and muscle in the usual fashion.  Subcu  was closed with 0 Vicryl, skin with metal staples, and a sterile Neosporin  dressing was applied, and she was placed in a shoulder immobilizer.  [name], M.D.    RAG/MEDQ  D:  09/14/2003  T:  09/14/2003  Job:  [id number]

## 2010-07-25 NOTE — Op Note (Signed)
NAME:  Colleen Roberts, Colleen Roberts                          ACCOUNT NO.:  [id number]   MEDICAL RECORD NO.:  [id number]                   PATIENT TYPE:  OIB   LOCATION:  4735                                 FACILITY:  [facility]   PHYSICIAN:  Veneda Melter, M.D. [facility]               DATE OF BIRTH:  [birth year]   DATE OF PROCEDURE:  11/11/2001  DATE OF DISCHARGE:  11/12/2001                                 OPERATIVE REPORT   PROCEDURES PERFORMED:  1. Abdominal angiogram.  2. Bilateral lower extremity angiogram.  3. PTA and stent placement of the right superficial femoral artery.   PREOPERATIVE DIAGNOSES:  1. Peripheral vascular disease.  2. Claudication.   HISTORY:  The patient is an 77 year-old white female with a known history of  peripheral vascular disease.  She has previously undergone percutaneous  intervention with stent placement of the right superficial femoral artery in  February of 2003.  At that time the patient had severe narrowing of 70% in  the mid right SFA.  She had done well post procedure with almost normal  ankle brachial indices and complete resolution of symptoms.  Unfortunately,  over the past several weeks she has had recurrence of right lower extremity  claudication and an ankle brachial indices on October 26, 2001 were 0.75 on  the right and 1.0 on the left.  She presents for further assessment.   TECHNIQUE:  Informed consent was obtained.  The patient was brought to the  peripheral vascular laboratory and a 5 French sheath was placed in the left  femoral artery using the modified Seldinger technique.  A 5 French pigtail  catheter was advanced into the abdominal aorta and abdominal aortogram with  bilateral lower extremity angiogram performed using power injections of  contrast and digital subtraction angiography.  This showed the abdominal  aorta to have severe eccentric plaque of 50 to 60%, above the _____  bifurcation.  The left iliac artery had mild diffuse disease of 30%.   The  superficial femoral artery had moderate narrowing of 50% in the proximal  segment. There was three vessel runoff.  The right iliac artery also had  moderate disease of 30%.  The superficial femoral artery had narrowing of  50% in the proximal segment.  There was then a high grade narrowing of 70%  in the mid section above the area of previous stenting.  The stent in the  mid right SFA was patent with moderate restenosis in the mid section of 50  to 60%.  There was three vessel runoff on the right.   With these findings we elected to proceed with percutaneous intervention of  the right superficial femoral artery.  Using an IMA catheter with wires  positioned across the aortic bifurcation, a 6 French Balkan sheath was  positioned in the right iliac artery.  The patient was given 3,000 units of  heparin systemically.  The  Warley wire was advanced in the distal left SFA  and a 7 x 6 Smart stent introduced.  This was then positioned in the mid  right superficial femoral artery across the severe narrowing.  It was placed  with slight overlap to the previously placed stent which was more distal.  A  5 x 4 SV5 balloon was then introduced and used to post dilate the mid  section of the previously placed stent at 10 atmospheres for 30 seconds and  then post dilated the new stent in its distal and proximal segments at 10  atmospheres for 30 seconds.  Repeat angiography was then performed showing  an excellent result with no significant residual stenosis with full coverage  of the lesion and no evidence of vessel damage.  There was brisk flow  distally with no evidence of thromboembolic phenomenon.  The Balkan sheath  was then retracted and a short 6 French sheath was placed in the left  femoral artery.  The patient was then transferred to the holding area where  the sheath would be removed when the EST return was normal.  She tolerated  the procedure well.   FINAL RESULTS:  Successful PTA and  stent placement to the mid right  superficial femoral artery with reduction of the 70% narrowing to 0% with  placement of a 7 x 6 Smart stent dilated to 5 mm.                                               Veneda Melter, M.D. [facility]    NG/MEDQ  D:  11/11/2001  T:  11/13/2001  Job:  [id number]   cc:   Fredirick Maudlin, M.D.   Thomas C. Wall, M.D. [facility]

## 2010-07-25 NOTE — Discharge Summary (Signed)
[place]. [facility]  Patient:    Colleen Roberts, Colleen Roberts                       MRN: [id number] Adm. Date:  [id number] Disc. Date: [id number] Attending:  Waldo Laine Dictator:   Lissa Merlin, P.A.C.                           Discharge Summary  DATE OF BIRTH:  10/12/1918  PHYSICIANS:  Surgeon - Ramon Dredge B. Tyrone Sage, M.D., cardiologist - Jesse Sans. Wall, M.D.  DATE OF DISCHARGE (expected):  June 11, 1999  ADMISSION DIAGNOSES: 1. Unstable angina. 2. Chronic cough x 10 years, unknown etiology. 3. Hypertension. 4. Hyperlipidemia. 5. Gastroesophageal reflux disease. 6. Left bundle-branch block on EKG.  DISCHARGE DIAGNOSES: 1. Three-vessel coronary artery disease per cardiac catheterization. 2. Status post coronary artery bypass grafting.  PROCEDURES: 1. Cardiac catheterization on June 03, 1999 indicated severe three-vessel CAD    and ejection fraction of 60%.  It demonstrated 80% distal left main    lesion, greater than 90% proximal LAD lesion, greater than 90% lesion in    the small diagonal, 60% circumflex lesion, and 70-80% proximal right    obstruction. 2. CABG x 4 on June 06, 1999 with the following grafts:  LIMA to LAD,    saphenous vein graft to diagonal, saphenous vein graft to OM, saphenous    vein graft to distal RCA.  BRIEF HISTORY:  The patient is a 77 year old female with progressive anginal symptoms over the past several months which worsened recently before admission.  Because of her symptoms, she was admitted for cardiac catheterization by Dr. Gerri Spore on June 03, 1999.  Colleen Roberts is widowed. She has four children who are alive and well.  One is an Astronomer. at [employer].  She denied tobacco or alcohol use.  It should also be noted that Colleen Roberts had a thyroidectomy in the past but has not been on any supplement for this.  HOSPITAL COURSE:  Colleen Roberts did well with her cardiac catheterization and was stable afterwards.  CVTS  consult was called for and Dr. Tyrone Sage evaluated Colleen Roberts and the available data, and concluded that coronary artery bypass grafting was the best treatment for her.  He discussed the details of the surgery, the risks and benefits and alternatives of the surgery.  Colleen Roberts was agreeable and surgery was tentatively scheduled for the following Friday or Monday.  Meanwhile, Colleen Roberts was being followed by cardiology. Preoperative Doppler studies were completed.  This indicated very small plaques with elevated velocities in the external carotid and the left internal carotid, particularly in the left internal carotid.  Meanwhile, Colleen Roberts remained stable, waiting for her surgery.  She was receiving IV nitroglycerin and heparin.  She underwent the procedure on June 06, 1999 without complications and was transferred to SICU in stable condition on dopamine drip.  Postoperative day #1, she was afebrile.  Vital signs were stable.  Drip had been weaned to two drops.  She was in normal sinus rhythm.  Lungs were clear. Chest tubes were discontinued and she was undergoing diuresis.  On postoperative day #2, she was again doing very well.  Vital signs were stable. Hematocrit was 29.5.  She was awake and alert.  Incisions were stable.  She was deemed suitable to transfer to unit 2000, which she did later that day. She was able to  walk about the room.  She had some episodes of nausea. Abdominal exam was benign.  Pain medication was changed to Ultram. Postoperative day #3, vital signs were stable.  She was afebrile.  Her nausea had resolved and she was undergoing cardiac rehab.  Her amylase came back normal.  She was walking well with rehab.  O2 saturations remained satisfactory.  Postoperative day #4, she continued to improve and was noted to be making rather rapid progress, especially for her age.  Her daughter, who is also a Engineer, civil (consulting), indicated that she planned to stay with her after  discharge. Postoperative day #4, she is doing very well and her incisions are healing well, she is taking p.o. well, her bowel and bladder are well, she is ambulating well.  Her rhythm is stable, and it is felt that should she continue her current course she should be ready for discharge pending satisfactory morning rounds on Wednesday, June 11, 1999.  MEDICATIONS: 1. Tenormin 25 mg 1 p.o. q.12h. 2. Lipitor 10 mg 1 p.o. q.h.s. 3. Prilosec 20 mg 1 p.o. q.d. 4. Ultram 50 mg 1 p.o. q.6h. 5. Evista 60 mg 1 p.o. q.d. 6. Coated aspirin 325 mg 1 p.o. q.d. 7. Humibid LA 600 mg 2 tablets p.o. q.12h.  ALLERGIES:  MORPHINE and CONTRAST DYE.  SPECIAL INSTRUCTIONS:  Colleen Roberts is instructed to avoid strenuous activity. No lifting more than 10 pounds.  She is told to walk daily.  She is told to do no driving.  She can shower, and she is told to use her incentive spirometer daily.  DIET:  She is told to maintain a low fat/low cholesterol diet.  WOUND CARE:  She is to keep her incision clean and dry, to use soap and water only.  She has been instructed that a home health nurse will visit her on or around Friday, April 13 to discontinue her staples.  FOLLOW-UP: 1. She is instructed to get a chest x-ray when she sees Dr. Daleen Squibb in two weeks.    Colleen Roberts is instructed to call Dr. Rex Kras office in [place] to    arrange a two-week follow up appointment. 2. Patient is to see Dr. Tyrone Sage in three weeks.  The office will call her    with the day and time of the appointment.  She is to bring the chest    x-ray with her. DD:  06/10/99 TD:  06/12/99 Job: [id number]

## 2010-07-25 NOTE — Cardiovascular Report (Signed)
   NAME:  Colleen Roberts, Colleen Roberts                          ACCOUNT NO.:  [id number]   MEDICAL RECORD NO.:  [id number]                   PATIENT TYPE:  INP   LOCATION:  3713                                 FACILITY:  [facility]   PHYSICIAN:  Salvadore Farber, M.D.             DATE OF BIRTH:  1919-03-07   DATE OF PROCEDURE:  07/13/2002  DATE OF DISCHARGE:  07/15/2002                              CARDIAC CATHETERIZATION   PROCEDURE:  Left heart catheterization, left ventriculography, saphenous  vein graft angiography, left subclavian angiography, left internal mammary  artery angiography.   INDICATIONS:  Syncope.   PROCEDURAL TECHNIQUE:  Informed consent was obtained.  Under 1% lidocaine  local anesthesia, a 6-French sheath was placed in the right femoral artery.  Diagnostic angiography was performed using JL4, JR4, and LIMA catheters.  Ventriculography was performed using a pigtail catheter in the RAO  projection.  The patient tolerated the procedure well and was transferred to  the holding room in stable condition.   Dictation ended at this point.                                               Salvadore Farber, M.D.    WED/MEDQ  D:  09/21/2002  T:  09/23/2002  Job:  [id number]  Oneal Deputy. Juanetta Gosling, M.D.  [address]  [place]  [place] [address]  Fax: [phone]   Jesse Sans. Wall, M.D.   cc:   Oneal Deputy. Juanetta Gosling, M.D.  [address]  [place]  [place] [address]  Fax: [phone]   Jesse Sans. Wall, M.D.

## 2010-07-25 NOTE — Discharge Summary (Signed)
NAME:  Colleen, Roberts                          ACCOUNT NO.:  [id number]   MEDICAL RECORD NO.:  [id number]                   PATIENT TYPE:  INP   LOCATION:  3713                                 FACILITY:  [facility]   PHYSICIAN:  Doylene Canning. Ladona Ridgel, M.D.               DATE OF BIRTH:  Mar 23, 1918   DATE OF ADMISSION:  07/12/2002  DATE OF DISCHARGE:  07/15/2002                           DISCHARGE SUMMARY - REFERRING   DISCHARGE DIAGNOSES:  1. Unexplained syncope.     a. Status post EPS this admission with negative findings.  2. Coronary artery disease status post coronary artery bypass graft.     a. Cardiac catheterization this admission revealing widely patent bypass        grafts, normal left ventricular size and systolic function.  3. Abnormal chest CT this admission.     a. Results:  No pulmonary emboli.  Spiculated soft tissue attenuation in        both apices.  A 5 mm opacity in central right upper lobe.  The patient        needs follow-up CT in three months and follow-up with pulmonologist.  4. Left bundle branch block.  5. Hypertension.  6. History of skin cancer.  7. Status post cholecystectomy.  8. Peripheral vascular disease.   PROCEDURES PERFORMED:  1. Cardiac catheterization by Salvadore Farber, M.D. on Jul 13, 2002.  2. Electrophysiology study by Doylene Canning. Ladona Ridgel, M.D. on Jul 14, 2002.   HOSPITAL COURSE:  This 77 year old female was originally seen at [facility] status post syncope and fall.  She was then transferred to [facility] for further evaluation.  She lost consciousness for a few  seconds.  The spell occurred suddenly and without warning.  She felt bad and  complained of right-sided chest pain.  She did note shortness of breath over  the past several weeks with exertion, but no peripheral edema.  She did have  nausea, but no palpitations.  The patient was admitted and placed on  heparin.  Her Diovan/HCT was held.  She subsequently ruled out for  myocardial infarction by enzymes.  It was decided she should proceed with  cardiac catheterization to rule out ischemia for her unexplained syncope.  D-  dimer was also checked to rule out possibility of pulmonary embolism.  This  was elevated at 3.23.  The patient went to cardiac catheterization on Jul 13, 2002.  The results are noted above.  Given the patent grafts noted on the  catheterization, it was felt that the patient should proceed with EPS  testing.  Because of the patient's elevated D-dimer, she was sent for chest  CT to rule out pulmonary embolism.  The chest CT did return showing no  pulmonary embolism, but with abnormal results as noted above.  The patient  underwent EPS testing by Doylene Canning. Ladona Ridgel, M.D. on Jul 14, 2002.  There was no  dual AV nodal physiology and no VT noted.  Doylene Canning. Ladona Ridgel, M.D. felt that  patient could go home the following day after the procedure.  She would need  close follow-up with chest CT.  On Jul 15, 2002 Charlton Haws, M.D. saw the  patient and felt she was stable enough for discharge to home.  The patient  was instructed to follow up with Ramon Dredge L. Juanetta Gosling, M.D. in [place] for  her abnormal chest CT.  She would need a follow-up chest CT in three months.  She has been instructed to do no driving until she sees our team back in  follow-up.   LABORATORIES:  White count 10,600, hemoglobin 12.5, hematocrit 37.1,  platelet count 194,000.  INR on admission 0.8.  D-dimer 3.23.  Sodium 138,  potassium 3.8, chloride 105, CO2 27, glucose 92, BUN 12, creatinine 0.8.  Total bilirubin 0.6, alkaline phosphatase 99, AST 23, ALT 25, total protein  6.6, albumin 3.7, calcium 8.9.  Cardiac enzymes negative x2.  Chest CT as  noted above.  Admission chest x-ray:  Hyperinflation consistent with changes  in COPD.  No active chest disease radiographically.   DISCHARGE MEDICATIONS:  1. Lipitor 20 mg q.h.s.  2. Norvasc 10 mg daily.  3. Aspirin 325 mg daily.  4.  Nitroglycerin p.r.n. chest pain.  5. The patient will be advised to stop taking her Diovan/HCT for now as her     blood pressure is stable off this medication.   PAIN MANAGEMENT:  Tylenol as needed.   ACTIVITY:  No driving until she is cleared by cardiology.   DIET:  Low salt, low fat.   WOUND CARE:  The patient to call our office for any groin swelling,  bleeding, or bruising.   SPECIAL INSTRUCTIONS:  The patient has been advised she needs a repeat CT  scan of her chest in three months to evaluate the abnormal chest CT scan  that was done at [facility] this admission.   FOLLOW UP:  The patient will need to see Maisie Fus C. Wall, M.D. in the next  two weeks.  She can also follow up with the P.A.  She will probably need  follow-up with Doylene Canning. Ladona Ridgel, M.D. in [place] in the next six to eight  weeks.  This can be sorted out at her follow-up appointment at our office in  [place] in two weeks.  The patient needs to follow up with Ramon Dredge L.  Juanetta Gosling, M.D. in one to two weeks and she should call him for an  appointment.     Tereso Newcomer, P.A.                        Doylene Canning. Ladona Ridgel, M.D.    SW/MEDQ  D:  07/15/2002  T:  07/17/2002  Job:  [id number]   cc:   Thomas C. Wall, M.D.   Edward L. Juanetta Gosling, M.D.  [address]  [place]  [place] [address]  Fax: [phone]   Doylene Canning. Ladona Ridgel, M.D.

## 2010-07-25 NOTE — Discharge Summary (Signed)
[place]  Patient:    Colleen Roberts, Colleen Roberts Visit Number: [id number] MRN: [id number]          Service Type: DSU Location: [phone] Attending Physician:  Veneda Melter Dictated by:   Lavella Hammock, P.A.-C. Admit Date:  04/12/2001 Discharge Date: 04/13/2001   CC:         Kari Baars, M.D.  Thomas C. Wall, M.D. [facility]   Referring Physician Discharge [place]  DATE OF BIRTH:  Aug 19, 1918  PROCEDURES: 1. Lower extremity angiogram. 2. Percutaneous intervention with stent to the lower extremity. 3. Abdominal aortogram. 4. PTA to the right SFA.  HOSPITAL COURSE:  Colleen Roberts is an 77 year old female with a history of coronary artery disease and bypass surgery in March 2001.  She developed claudication symptoms and was evaluated in the office by Dr. Chales Abrahams.  She had ABIs there showing significant decrease and was scheduled for peripheral angiogram and possible intervention.  She was admitted to the hospital on April 12, 2001, for this.  She had an abdominal aortogram and bilateral lower extremity angiograms.  The abdominal aortogram showed a 50% left renal artery and a 50% distal aorta.  There was a 99% mid SFA and a 70% ostial SFA on the right.  There was three-vessel runoff.  She had PTA to the right SFA, reducing two stenoses from 70% to 20% to 30%.  She had a stent to right SFA, reducing that stenosis from 99% to 20%.  She was hydrated overnight and started on Plavix.  The next day, her BMET was within normal limits, and she was ambulating without difficulty.  Her cath site was stable without hematoma or bruit.  She was considered stable for discharge on April 13, 2001.  LABORATORY VALUES:  Sodium 135, potassium 3.5, chloride 103, CO2 28, BUN 12, creatinine 0.7, glucose 115.  DISCHARGE CONDITION:  Improved.  DISCHARGE DIAGNOSES:  1. Peripheral vascular disease, status post percutaneous transluminal     angioplasty and stent to the right  superficial femoral artery this     admission.  2. History of 50% left renal artery stenosis and 50% distal aortic stenosis     from the abdominal aortogram.  3. Status post aortocoronary bypass surgery in March 2001, with left internal     mammary artery to left anterior descending, saphenous vein graft to     diagonal 1, and saphenous vein graft to obtuse marginal 1, saphenous vein     graft to distal right coronary artery.  4. Preserved left ventricular function with an ejection fraction of 60% by     catheterization, 2001.  5. Status post cholecystectomy and bilateral cataract surgery as well as     partial thyroidectomy.  6. History of gastroesophageal reflux disease.  7. History of hypertension.  8. History of hyperlipidemia.  9. History of ALLERGY to MORPHINE, and cough with ACE INHIBITOR. 10. She has significant fatigue with Lopressor. 11. History of a left bundle-branch block.  DISCHARGE INSTRUCTIONS: 1. Her activity level is to include no driving, sexual, or strenuous activity    for two days.  She is then to resume ambulation gradually. 2. She is to stick to a low fat diet. 3. She is to call the office for problems with the cath site. 4. She is to follow up with Dr. Daleen Squibb and Dr. Juanetta Gosling as scheduled. 5. She is to get ABIs on April 29, 2001, at 10 a.m. and follow up with    Dr.  Chales Abrahams on May 04, 2001.  DISCHARGE MEDICATIONS 1. Plavix 75 mg q.d. 2. Lipitor 20 mg q.d. 3. Diovan HCT 160/25 mg q.d. 4. Aspirin 325 mg q.d. 5. Norvasc 10 mg q.d. 6. Zantac or Prilosec p.r.n. Dictated by:   Lavella Hammock, P.A.-C. Attending Physician:  Veneda Melter DD:  04/13/01 TD:  04/13/01 Job: [id number]

## 2010-07-25 NOTE — Op Note (Signed)
NAME:  Colleen Roberts, Colleen Roberts                          ACCOUNT NO.:  [id number]   MEDICAL RECORD NO.:  [id number]                   PATIENT TYPE:  AMB   LOCATION:  DAY                                  [facility]ACILITY:  [facility]   PHYSICIAN:  Georges Lynch. Gioffre, M.D.             DATE OF BIRTH:  1918-08-20   DATE OF PROCEDURE:  01/20/2002  DATE OF DISCHARGE:                                 OPERATIVE REPORT   PREOPERATIVE DIAGNOSES:  1. Complete tear of the rotator cuff tendon on the right with complete     retraction of her tendon.  2. Severe impingement syndrome on the right.   POSTOPERATIVE DIAGNOSES:  1. Complete tear of the rotator cuff tendon on the right with complete     retraction of her tendon.  2. Severe impingement syndrome on the right.   OPERATION:  1. Partial acromionectomy and acromioplasty.  2. Partial repair of the rotator cuff tendon.  Note, the tendon basically     was absent for a small portion medially, and I advanced this to the     biceps tendon.   SURGEON:  Georges Lynch. Darrelyn Hillock, M.D.   ASSISTANT:  Ebbie Ridge. Paitsel, P.A.   DESCRIPTION OF PROCEDURE:  Under general anesthesia, the patient first had  an interscalene block prior to bringing her back to surgery, and we brought  her back to surgery under general anesthesia.  A sterile routine prep and  draping of the right shoulder was carried out.  An incision was made over  the anterior aspect of the right shoulder and bleeders identified and  cauterized.  I then identified the acromion.  It was severely impinging in  her humerus.  I did a partial acromionectomy and acromioplasty after I  separated the deltoid tendon from the acromion.  She basically had no to  minimal tendon remaining.  I searched posteriorly, medially, and laterally  and found a small portion medially, advanced it laterally across into the  biceps tendon and sutured it in place.  Note, there was no tendon remaining  to put a graft in.  I thoroughly irrigated  out the area basically after I  did the acromionectomy with the oscillating saw.  I evened the bone end out  with a bur.  We thoroughly irrigated the area out.  I reapproximated the  deltoid tendon and muscle in the usual fashion.  The skin was finally closed  with metal staples after we did a subcutaneous closure.  After that was  done, I then dressed the shoulder Neosporin dressing, placed her in a sling.  She had 1 g of Ancef preop.                                               Ronald A. Gioffre,  M.D.    RAG/MEDQ  D:  01/20/2002  T:  01/20/2002  Job:  [id number]

## 2010-07-25 NOTE — Procedures (Signed)
NAMECORRETTA, MUNCE NO.:  [id number]   MEDICAL RECORD NO.:  [id number]         PATIENT TYPE:  REC   LOCATION:  RAD                           FACILITY:  [facility]   PHYSICIAN:  Vida Roller, M.D.   DATE OF BIRTH:  April 25, 1918   DATE OF PROCEDURE:  DATE OF DISCHARGE:                                    STRESS TEST   EXERCISE MYOVIEW STUDY     Ms. Colleen Roberts is an 77 year old female with known coronary artery disease status  post CABG in 2001 with the following grafts:  LIMA to LAD, SVG to D, SVG to  OM, SVG to distal RCA.  The patient had a catheterization in 2004, revealing  patent grafts and a normal EF.  She is now scheduled for bilateral upper lid  blepharoplasty.  She was noted to have an abnormal electrocardiogram at her  last office visit.  Therefore, an exercise Myoview was scheduled.  She  denied any chest discomfort or shortness of breath.   BASELINE DATA:  Electrocardiogram reveals a sinus rhythm at 63 beats per  minute with a nonspecific IVCD with ST depression in the inferolateral leads  and normal axis.  This is markedly different than her EKG done on October 21, 2004.  Baseline blood pressure is 144/60.   The patient exercised for a total of five minutes and 11 seconds to Bruce  protocol stage II and 7.0 METS.  Maximum heart rate was 146 beats per  minute, which is 107% of predicted maximum.  Maximum blood pressure was  182/62 and resolved down to 158/60 in recovery.  Electrocardiogram revealed  no significant arrhythmias.  She had a baseline abnormal EKG that worsened  with increased heart rate.   The patient denied any chest discomfort or shortness of breath with  exercise.  Exercise was stopped secondary to fatigue.   Final images and results are pending MD review.      Jae Dire, P.A. [facility]      Vida Roller, M.D.  Electronically Signed    AB/MEDQ  D:  10/27/2004  T:  10/27/2004  Job:  [id number]

## 2010-07-25 NOTE — Consult Note (Signed)
[place]  Patient:    Colleen Roberts, Colleen Roberts                       MRN: [id number] Proc. Date: 06/03/99 Adm. Date:  [id number] Attending:  Mirian Mo Dictator:   [phone] CC:         Gwenith Daily. Tyrone Sage, M.D.                          Consultation Report  Patient referred by Dr. Juanito Doom and Dr. Gerri Spore, patients primary doctor Dr.  Juanetta Gosling.  REASON FOR CONSULTATION:  Coronary artery disease.  HISTORY OF PRESENT ILLNESS:  Patient was referred to cardiology because of chest tightness associated with shortness of breath since 12/25/98.  Patient notes at least three episodes of chest discomfort associated with shortness of breath first while just being exposed to cold and the more recent episodes were walking up a  hill at the [facility].  This led to referral for cardiac catheterization which was performed today by Dr. Gerri Spore.  Patient has a distal 80% left main, a long proximal 95% LAD, diffuse disease in the right coronary artery with areas p to 70-80%.  Overall LV function was preserved.  Patient did have 25% right renal artery stenosis, 60% osteal left artery stenosis, just diffuse atherosclerotic disease in the subclavian artery, but with patent mammary.  PAST MEDICAL HISTORY: 1. History of chronic congestion with cough, especially with exertion x 10 years.    Patient notes she had seen numerous doctors for this, but with no known    etiology. 2. History of hypertension, though the patient notes that this is relatively recent    and normally she has not had hypertension. 3. History of hyperlipidemia. 4. Postmenopausal. 5. Patient denies any diabetes mellitus.  PAST SURGICAL HISTORY:  Thyroidectomy.  Patient has not been on replacement in he past.  SOCIAL HISTORY:  Patient is widowed.  Husband died in 12/25/22 secondary to severe emphysema.  Patient has four children who are alive and well.  One is an Charity fundraiser  at [employer].  She denies any tobacco or alcohol use.  MEDICATIONS: 1. Prilosec 20 mg a day. 2. Lipitor 20 mg a day. 3. Evista 60 mg a day. 4. Duratuss 500 mg b.i.d. 5. Tussionex cough syrup p.r.n.  ALLERGIES:  MORPHINE which patient notes sensitivity to narcotics.  LABORATORIES:  At time of catheterization revealed hematocrit 45%, creatinine 0.9, TSH 2.96.  Patient notes that she had had a chest x-ray and carotid Doppler studies done two days ago but these results are not available in this chart at this time.  REVIEW OF SYSTEMS:  Patient denies any constitutional symptoms.  She has had no  weight loss and no fever.  She does note recent increase in her blood pressure.  She denies any visual changes with the exception of cataract surgery in the past. She denies any definite stroke.  Approximately one month ago she had an episode  where she had trouble with balance but this has cleared, has not recurred.  She  denies any amaurosis fugax.  She denies any symptoms of thyroid insufficiency. She denies diabetes mellitus.  As noted, she has had a chronic cough for approximately 10 years.  No hemoptysis.  She denies any bowel or blood in her stool or urine.  Note upon further questioning the patient did remember that she had had two previous  cesarean sections.  Patient denies any psychiatric history.  She denies any claudication in the lower extremities.  Patient denies any skin changes or thrombophlebitis.  PHYSICAL EXAMINATION:  GENERAL:  Patient is an elderly appearing female.  VITAL SIGNS:  Temperature 99.1.  Blood pressure 140/80.  Sinus rhythm at 60. Room air saturation 97%.  HEENT:  Patients eye movements are normal.  Gross mouth examination is normal.  NECK:  She has no carotid bruits.  She has no palpable thyroid mass.  LUNGS:  Clear bilaterally without wheezing.  CARDIAC:  Reveals regular rhythm and rate.  She has no murmur or  gallop appreciated.  ABDOMEN:  Reveals a healed lower midline abdominal incision from previous cesarean sections.  She has no palpable organomegaly.  Abdominal aorta is not palpably enlarged.  She has dressing over the right groin following catheterization.  EXTREMITIES:  She has a palpable left femoral pulse.  She has palpable popliteal pulses.  DP and PT pulses palpable bilaterally.  She has small varicosities in oth lower extremities.  The cineangiograms were reviewed and discussed with the patient and her daughter. Because of the patients symptoms and three vessel disease including an element f left main disease, coronary artery bypass grafting has been recommended.  The patient agrees.  The risks and options have been discussed with her in detail.  Tentatively plan for surgery March 30.  Prior to that time we will need to obtain the previous studies done in the cardiologists office as outpatient including the chest x-ray and carotid Doppler studies. DD:  06/03/99 TD:  06/03/99 Job: [id number]

## 2010-07-25 NOTE — Discharge Summary (Signed)
   NAME:  Colleen Roberts, Colleen Roberts                          ACCOUNT NO.:  [id number]   MEDICAL RECORD NO.:  [id number]                   PATIENT TYPE:  OIB   LOCATION:  4735                                 FACILITY:  [facility]   PHYSICIAN:  Dian Queen, M.D.                   DATE OF BIRTH:  1919-02-25   DATE OF ADMISSION:  11/11/2001  DATE OF DISCHARGE:  11/12/2001                                 DISCHARGE SUMMARY   HISTORY OF PRESENT ILLNESS:  The patient is an 77 year old white female,  patient of Dr. Juanetta Gosling who is known to Dr. Chales Abrahams for her peripheral vascular  disease.  She has know coronary disease as well and underwent CABG in 2001.  She has treated hyperlipidemia, treated hypertension, known ACE intolerance,  and a chronic left bundle.  She was admitted on this occasion for a  claudication and for peripheral angiography and intervention.   Dr. Chales Abrahams was able to stent her right SFA from 70 to 0% without  complication.  He ordered her to stay on Plavix for one month.  The patient  was discharged home the following day.   ACCESSORY CLINICAL FINDINGS:  Normal electrolytes and renal profile.  BUN  19, creatinine 0.9.  Hemoglobin 12.6 with hematocrit 36.8.   FINAL DIAGNOSES:  1. Peripheral vascular disease with percutaneous transluminal angioplasty of     right superficial femoral artery this admission.  2. Status post coronary artery bypass graft 2001.  3. Controlled hypertension.  4. Treated hyperlipidemia.  5. ACE intolerance.  6. Chronic left bundle.   DISPOSITION:  Continue same medications plus Plavix for one month.  Follow-  up with Dr. Chales Abrahams in a couple of weeks.                                               Dian Queen, M.D.    BY/MEDQ  D:  11/12/2001  T:  11/14/2001  Job:  [id number]   cc:   Fredirick Maudlin, M.D.   Veneda Melter, M.D. [facility]

## 2010-07-25 NOTE — Procedures (Signed)
[place]  Patient:    DALYCE, RENNE Visit Number: [id number] MRN: [id number]          Service Type: DSU Location: [id number] Attending Physician:  Veneda Melter Dictated by:   Veneda Melter, M.D. Proc. Date: 04/12/01 Admit Date:  04/12/2001 Discharge Date: 04/13/2001   CC:         Vascular Lab at [employer] [facility]. Daleen Squibb, M.D. [facility]  Kari Baars, M.D.   Procedure Report  PROCEDURES PERFORMED: 1. Abdominal aortogram. 2. Bilateral lower extremity angiograms. 3. PTA of the right superficial femoral artery. 4. Stent placement of the right superficial femoral artery.  DIAGNOSES: 1. Peripheral vascular disease. 2. Claudication.  SURGEON:  Veneda Melter, M.D.  HISTORY:  The patient is an 77 year old white female with history of coronary artery disease, status post coronary artery bypass graft surgery who presents with worsening right lower extremity claudication.  This has progressed to the point where she is unable to ambulate even minimal distances without discomfort.  Ankle brachial indices on January 31, 2001, were 0.47 on the right and 1.03 on the left.  Due to progression of symptoms, she presents for further assessment and treatment.  TECHNIQUE:  Informed consent was obtained.  The patient was brought to the catheterization lab.  A 5-French sheath was placed in the left femoral artery and a 5-French pigtail catheter was then advanced in the abdominal aorta, and abdominal aortogram performed using power injection of contrast.  A left lower extremity angiogram was then performed using power injection of contrast through the left femoral sheath.  Using an IMA catheter, a Wholey wire was placed across the iliac bifurcation and the end-hole catheter positioned in the right iliac artery.  The right lower extremity angiogram was then performed using power injection of contrast through the end-hole catheter.  Initial findings are as  follows:  1. Abdominal aorta is of normal caliber in the infrarenal segment.  There is    severe atheromatous disease with a focal eccentric plaque which compromised    the lumen by at least 50-60%.  There are two right renal arteries.  The    superior larger vessels has mild disease of 30%.  The inferior vessel has    mild irregularities.  There is a single bifurcating left renal artery with    moderate stenosis of 50% at its origin. 2. Left lower extremity - the left iliac artery has mild irregularities of    20%.  The common femoral artery has an eccentric plaque of 30%.    Superficial femoral artery has moderate disease at 30%.  In the proximal    and distal section, there is a focal narrowing of 50% in the mid section.    The trifurcation vessels are patent in the proximal segment.  The anterior    tibial artery is a small vessel, otherwise three vessel runoff is noted to    the ankle. 3. Right lower extremity - the right iliac artery is patent with mild    irregularities.  The superficial femoral artery has an ostial narrowing of    70%.  There is then a long segment of disease of at least 70% at the    junction of the proximal mid third of the SFA with a subtotal occlusion in    the mid section of the vessel.  The distal SFA has mild disease of 30%,    extending into the popliteal artery.  Trifurcation vessels are patent  and    three vessel runoff is noted to the ankle.  With these findings, we elected to proceed with percutaneous intervention of the right superficial femoral artery.  A 6-French _________ sheath was placed across the iliac bifurcation using the Wholey wire.  The patient was given heparin intravenously.  Using a 4 x 10 Powerflex balloon, the Wholey wire was used to probe the subtotal occlusion and was successfully passed through the distal SFA.  The 4 x 10 balloon was then used to dilate the mid SFA.  Two inflations were performed at 6 atmospheres for 60 seconds.   A single inflation was performed at the ostium of the SFA at 10 atmospheres for 60 seconds.  Repeat angiography showed significant improvement in vessel lumen and flow.  There was moderate residual disease of 50-60% in the mid SFA.  A 5 x 10 Powerflex balloon was then introduced.  Two inflations were performed in the mid section of the vessel at 4 atmospheres for 180 seconds, and a single inflation at the ostium at 6 atmospheres for 180 seconds.  Repeat angiography showed an excellent result at the ostium with no significant residual stenosis.  The mid SFA had moderate residual disease of 50% and there was evidence of a complex dissection at the site of subtotal occlusion.  It was thus felt that percutaneous angioplasty would not stabilize the vessel and that stent placement would be necessary.  A 7 x 6 S.M.A.R.T. stent was introduced and positioned in the mid SFA and deployed.  This was then post dilated with a 5 x 10 Powerflex balloon at 6 atmospheres for 60 seconds.  Repeat angiography was now performed showing an excellent result with full coverage of the dissection and the occlusion. There was mild residual disease of 20% within the stented segment and immediately proximal to this, there was moderate disease of 30%.  At the origin of the SFA, there was less than 20% residual disease.  There was brisk flow through the SFA and in the distal vasculature.  No evidence of _______ phenomenon.  The ________ sheath was then retracted across the iliac bifurcation.  The patient was transferred to the holding area and sheath removed.  Manual pressure was applied and hemostasis achieved.  The patient tolerated the procedure well and was transferred to the floor in stable condition.  FINAL RESULT: 1. Successful percutaneous transluminal angioplasty of the ostium of the right     superficial femoral artery with reduction of 70% narrowing to 20%. 2. Successful percutaneous transluminal  angioplasty and stent placement in the    mid superficial femoral artery with reduction of sequential 70% and 99%    narrowing to 30% and 20% respectively with placement of a 7 x 6 S.M.A.R.T.    stent in the distal segment. Dictated by:   Veneda Melter, M.D. Attending Physician:  Veneda Melter DD:  04/12/01 TD:  04/13/01 Job: [id number]

## 2010-07-25 NOTE — Op Note (Signed)
Colleen Roberts. [facility]  Patient:    Colleen Roberts, Colleen Roberts                       MRN: [id number] Proc. Date: 06/06/99 Adm. Date:  [id number] Attending:  Waldo Laine CC:         Gwenith Daily. Tyrone Sage, M.D.             Thomas C. Wall, M.D. [facility]                           Operative Report  PREOPERATIVE DIAGNOSIS:  Coronary artery disease with a left main obstruction.  POSTOPERATIVE DIAGNOSIS:  Coronary artery disease with a left main obstruction.  SURGICAL PROCEDURE:  Coronary artery bypass grafting x 4 with the left internal  mammary to the left anterior descending coronary artery, a reverse saphenous vein graft to the diagonal coronary artery, a reverse saphenous vein graft to the obtuse marginal coronary artery, and a reverse saphenous vein graft to the distal right coronary artery.  SURGEON:  Gwenith Daily. Tyrone Sage, M.D.  FIRST ASSISTANT:  Gwenyth Bender, R.N., first assistant.  BRIEF HISTORY:  The patient is a 77 year old female with progressive anginal symptoms over the past several months, which worsened recently.  Because of this, the patient underwent cardiac catheterization, which demonstrated 80% distal left main, greater than 90% proximal LAD lesion, greater than 90% lesion in the small diagonal, 60% circumflex lesion, and 70-80% proximal right obstruction. Because of the patients significant left main disease and symptoms, coronary artery bypass  grafting was recommended.  The patient agreed and signed the informed consent.  DESCRIPTION OF PROCEDURE:  With Swan-Ganz and arterial line monitors in place, he patient underwent general endotracheal anesthesia without incidence.  The skin,  chest, and legs were prepped with Betadine and draped in the usual sterile manner. A vein was harvested from the right lower extremity and was of adequate quality and caliber.  A median sternotomy was performed.  The left internal mammary  artery as dissected down as a pedicle graft.  The distal artery was divided, and it had good free flow.  The pericardium was opened, and overall ventricular function was preserved.  The patient was systemically heparinized.  The ascending aorta and right atrium were cannulated, and the aortic root vent cardioplegia needle was introduced into the ascending aorta.  The patient was placed on cardiopulmonary  bypass 2.4 L/min/sq m.  The sites of the anastomoses were selected and dissected out of the epicardium.  The patients body temperature was cooled to 28 degrees.  An aortic Cross clamp was applied, and 500 cc of cold blood potassium cardioplegia was administered with rapid diastolic arrest to the heart.  Myocardial septal temperature was monitored at the Cross clamp.  Attention was turned first to the obtuse marginal coronary artery, which was opened and barely admitted a 1 mm probe. Using a running 7-0 Prolene, a distal anastomosis was performed.  Attention was  then turned to the diagonal coronary artery.  This was also a small, diffusely diseased vessel, and also barely admitting a 1 mm probe.  Using a running 7-0 Prolene, a distal anastomosis was performed with a segment of reverse saphenous vein graft.  Attention was then turned to the distal right coronary artery. This vessel was of good quality.  It was small, but it did admit a 1.5 mm probe distally.  Using a running 7-0 Prolene, a distal  anastomosis was performed. Attention was then turn to the left anterior descending coronary artery, and the mid portion of the vessel was opened and admitted a 1.5 mm probe proximally and a 1 mm probe distally.  Using a running 8-0 Prolene, the left internal mammary artery was anastomosed the left anterior descending coronary artery.  With the release of Edwards bulldog on the mammary artery, there was appropriate rise in myocardial  septal temperature.  The aortic Cross clamp was  removed with a total Cross clamp time of 52 minutes.  The patient spontaneously converted to a sinus rhythm.  A partial occlusion clamp was placed on the ascending aorta.  Three punch aortotomies were performed.  Each of the three vein grafts were anastomosed to the ascending aorta.  Air was evacuated from the grafts, and the partial occlusion clamp was removed.  Sites of anastomoses were inspected and were free of bleeding.  The patient was then ventilated and weaned from cardiopulmonary bypass without difficulty.  She remained hemodynamically stable and was decannulated in the usual fashion.  Protamine sulfate was administered with operative field homeostatic. Two atrial and two ventricular pacing wires were applied.  Graft marks were applied, a left pleural tube, and two mediastinal tubes were left in place.  The sternum was closed with #6 stainless steel wire.  The fascia closed with interrupted #0 Vicryl, running 3-0 Vicryl in the subcutaneous tissue, and a 4-0 subcuticular stitch in the skin edges.  Dry dressings were applied.  Sponge and needle counts were reported as correct at the completion of the procedure.  The patient tolerated the procedure without obvious complication and was transferred to the surgical intensive care  unit for further postoperative care.  Because of low hematocrit while on pump, he patient was given two units of packed red blood cells. DD:  06/06/99 TD:  06/07/99 Job: [id number]

## 2010-07-25 NOTE — Assessment & Plan Note (Signed)
[facility]                            CARDIOLOGY OFFICE NOTE   MARCELLINA, JONSSON                       MRN:          [id number]  DATE:01/15/2006                            DOB:          January 23, 1919    IDENTIFICATION:  Ms. Ahmed is a 77 year old woman, with a history of  coronary artery disease (status post CABG 2001), peripheral vascular  disease (status post PTA to the right femoral artery 2003),  hypertension, cerebral vascular disease, and DJD. She was last seen in  cardiology clinic back in August by Dr. Dionicio Stall. She went on to have  a Myoview scan before __________ . This showed no inducible ischemia.   Note, the patient had a cardiac catheterization back in 2004, that  showed patent grafts and normal left ventricular function.   In addition, the patient has been seen by Dr. Geralynn Rile, since being  seen here. She saw him back in November 2006. ABI was 0.86. She appeared  to have a new lesion distal to the stent develop, but is asymptomatic.  The plan was for medical therapy and exercise.   In the interval, since seeing the patient has done well from a cardiac  standpoint, she denies chest pain and there is no shortness of breath.   CURRENT MEDICATIONS:  1. Aspirin 325 mg daily.  2. Diovan Hydrochlorothiazide 160/25 mg daily.  3. Caduet 20 mg daily.  4. Prilosec 20 mg daily.   PHYSICAL EXAMINATION:  On exam, the patient is in no distress.  VITAL SIGNS: Blood pressure 150/68 on arrival; on my check 140/58, pulse  76 and regular, weight 114.  LUNGS: Clear.  NECK: Jugular venous pressure is normal, positive bruits on the left.  CARDIAC: Regular rate and rhythm. S1, S2, no S3 or S4. Grade 1-2/6  systolic murmur heard best at the best, consistent with aortic  sclerosis.  ABDOMEN: Benign.  EXTREMITIES: 1+ posterior tibial, no edema.   IMPRESSION:  1. Coronary artery disease, status post CABG six years ago. Clinically      doing  well. Myoview scan done in 2006, showed normal perfusion.      Would continue.  2. Hypertension, fair control, better with repeat today, would      continue on current regimen.  3. Dyslipidemia, we will check fasting lipid panel at her convenience.  4. Cerebral vascular disease, we will schedule for a repeat carotid      Doppler, as it has been about a year, to evaluate for progression.  5. Lower extremity vascular disease, again, continue walking and      continue other anti-hyperlipidemics.   I will set followup for August, sooner if problems develop, and will be  in touch with her regarding the test results.     Pricilla Riffle, MD, [facility]  Electronically Signed    PVR/MedQ  DD: 01/15/2006  DT: 01/16/2006  Job #: [id number]   cc:   Ramon Dredge L. Juanetta Gosling, M.D.

## 2010-08-06 ENCOUNTER — Ambulatory Visit (INDEPENDENT_AMBULATORY_CARE_PROVIDER_SITE_OTHER): Payer: Medicare Other | Admitting: Adult Health

## 2010-08-06 ENCOUNTER — Encounter: Payer: Self-pay | Admitting: Adult Health

## 2010-08-06 DIAGNOSIS — I454 Nonspecific intraventricular block: Secondary | ICD-10-CM

## 2010-08-06 DIAGNOSIS — R0602 Shortness of breath: Secondary | ICD-10-CM

## 2010-08-06 DIAGNOSIS — R609 Edema, unspecified: Secondary | ICD-10-CM

## 2010-08-06 DIAGNOSIS — I10 Essential (primary) hypertension: Secondary | ICD-10-CM

## 2010-08-06 DIAGNOSIS — I2510 Atherosclerotic heart disease of native coronary artery without angina pectoris: Secondary | ICD-10-CM

## 2010-08-06 DIAGNOSIS — I739 Peripheral vascular disease, unspecified: Secondary | ICD-10-CM

## 2010-08-06 MED ORDER — POTASSIUM CHLORIDE CRYS ER 20 MEQ PO TBCR: 20.0000 meq | EXTENDED_RELEASE_TABLET | Freq: Every day | ORAL | Status: DC

## 2010-08-06 MED ORDER — FUROSEMIDE 20 MG PO TABS: 20.0000 mg | ORAL_TABLET | Freq: Every day | ORAL | Status: DC

## 2010-08-06 NOTE — Assessment & Plan Note (Signed)
She is clearly edematous in bilateral feet and pretibial area. Lungs are clear, no JVD.  This may be from use of NSAIDS which she was taking prior to the edema. She states she was taking multiple doses during the day.  However, with PVD, I will check doppler studies to rule out DVT.  She will have labs drawn to evaluate kidney fx, CBC to rule out anemia, BNP.  I will give her 3 doses of Lasix 20mg  a day for 3 days, with potassium replacement.  Will check ECHO as one has not been done for over two years to assess LV fx. She will follow-up with Dr. Daleen Squibb in two weeks on a previously scheduled appointment.  Should tests come back positive for DVT will treat accordingly.

## 2010-08-06 NOTE — Assessment & Plan Note (Signed)
Elevated on this visit compared to previous visits.  She has lost weight compared to earlier visit.  Will monitor at this time.  Follow-up as planned.

## 2010-08-06 NOTE — Patient Instructions (Signed)
Your physician recommends that you return for lab work in: today  Your physician has recommended you make the following change in your medication: take Furosemide 20mg  and Potassium . for 3 days then stop  Your physician recommends that you have a bilateral Venous Ultrasound performed, there are no special instructions for this test  Your physician has requested that you have an echocardiogram. Echocardiography is a painless test that uses sound waves to create images of your heart. It provides your doctor with information about the size and shape of your heart and how well your heart's chambers and valves are working. This procedure takes approximately one hour. There are no restrictions for this procedure.  Your physician recommends that you schedule a follow-up appointment in: 2 weeks

## 2010-08-06 NOTE — Progress Notes (Signed)
HPI: Colleen Roberts is a 77 y/o CF patient of Dr. Juanito Doom who we are following for known history of CAD with CABG, carotid artery disease, hyperlipidemia.  She has not seen Dr. Daleen Squibb for one year as she has been asymptomatic.  She has had a recent compression fx on Easter of this year and now wears a brace and is on chronic pain medications. She was taking a good bit of naproxsin sodium along with some vicodin for pain control. She has noticed over the last 2-21/2 weeks that she has had some swelling in her legs bilaterally. However the last 2 two days it has become significant.  So much so that she cannot wear her shoes.  Sometimes it is mildly painful to walk because of the tightness in her feet and legs.  As a result she comes for evaluation.  Allergies  Allergen Reactions  . Morphine     Current Outpatient Prescriptions  Medication Sig Dispense Refill  . amLODipine (NORVASC) 10 MG tablet Take 1 tablet (10 mg total) by mouth daily.  90 tablet  0  . aspirin 325 MG tablet Take 325 mg by mouth daily.        Marland Kitchen atorvastatin (LIPITOR) 20 MG tablet Take 1 tablet (20 mg total) by mouth daily.  90 tablet  0  . Cholecalciferol (VITAMIN D) 400 UNITS capsule Take 400 Units by mouth daily.        . Coenzyme Q10 (CO Q 10) 100 MG CAPS Take 1 capsule by mouth daily.        . Cyanocobalamin (VITAMIN B 12 PO) Take 1 tablet by mouth daily.        . Homeopathic Products ([employer] LEG CRAMPS PAIN RELIEF) TABS Take 1 tablet by mouth daily.        . Multiple Vitamins-Minerals (MULTIVITAMIN WITH MINERALS) tablet Take 1 tablet by mouth daily.        Marland Kitchen omeprazole (PRILOSEC) 20 MG capsule Take 20 mg by mouth daily.        . valsartan-hydrochlorothiazide (DIOVAN-HCT) 320-25 MG per tablet Take 1 tablet by mouth daily.  90 tablet  0  . furosemide (LASIX) 20 MG tablet Take 1 tablet (20 mg total) by mouth daily. Take 1 tablet po X 3 days  3 tablet  0  . potassium chloride SA (K-DUR,KLOR-CON) 20 MEQ tablet Take 1 tablet (20 mEq total)  by mouth daily. Take 1 tablet daily with Furosemide  3 tablet  0    Past Medical History  Diagnosis Date  . Cancer     No past surgical history on file.  ROS: Review of systems complete and found to be negative unless listed above PHYSICAL EXAM BP 148/63  Pulse 84  Ht 4\' 11"  (1.499 m)  Wt 115 lb (52.164 kg)  BMI 23.23 kg/m2  SpO2 95% General: Well developed, well nourished, in no acute distress Head: Eyes PERRLA, No xanthomas.   Normal cephalic and atramatic  Lungs: Clear bilaterally to auscultation and percussion. Heart: HRRR S1 S2  Pulses are 2+ & equal.            Mild carotid bruit bilaterally. No JVD.  No abdominal bruits. No femoral bruits. Abdomen: Bowel sounds are positive, abdomen soft and non-tender without masses or                  Hernia's noted. Msk:  Back normal, normal gait. Normal strength and tone for age. Extremities: No clubbing, cyanosis 2+ pitting edema in  the feet with 1+-2+ pretibial edema. Doppler pulses 1+. Neuro: Alert and oriented X 3. Psych:  Good affect, responds appropriately EKG:  ASSESSMENT AND PLAN

## 2010-08-07 ENCOUNTER — Ambulatory Visit (HOSPITAL_COMMUNITY)
Admission: RE | Admit: 2010-08-07 | Discharge: 2010-08-07 | Disposition: A | Discharge status: Home / Self Care | Payer: Medicare Other | Source: Ambulatory Visit | Attending: Cardiology | Admitting: Cardiology

## 2010-08-07 ENCOUNTER — Ambulatory Visit (HOSPITAL_COMMUNITY)
Admission: RE | Admit: 2010-08-07 | Discharge: 2010-08-07 | Disposition: A | Discharge status: Home / Self Care | Payer: Medicare Other | Source: Ambulatory Visit | Attending: Adult Health | Admitting: Adult Health

## 2010-08-07 ENCOUNTER — Other Ambulatory Visit: Payer: Self-pay

## 2010-08-07 ENCOUNTER — Other Ambulatory Visit: Payer: Self-pay | Admitting: *Deleted

## 2010-08-07 DIAGNOSIS — I454 Nonspecific intraventricular block: Secondary | ICD-10-CM

## 2010-08-07 DIAGNOSIS — I10 Essential (primary) hypertension: Secondary | ICD-10-CM | POA: Insufficient documentation

## 2010-08-07 DIAGNOSIS — R609 Edema, unspecified: Secondary | ICD-10-CM

## 2010-08-07 DIAGNOSIS — M7989 Other specified soft tissue disorders: Secondary | ICD-10-CM | POA: Insufficient documentation

## 2010-08-07 DIAGNOSIS — I25810 Atherosclerosis of coronary artery bypass graft(s) without angina pectoris: Secondary | ICD-10-CM | POA: Insufficient documentation

## 2010-08-07 DIAGNOSIS — M79609 Pain in unspecified limb: Secondary | ICD-10-CM | POA: Insufficient documentation

## 2010-08-07 DIAGNOSIS — I2510 Atherosclerotic heart disease of native coronary artery without angina pectoris: Secondary | ICD-10-CM

## 2010-08-07 DIAGNOSIS — I739 Peripheral vascular disease, unspecified: Secondary | ICD-10-CM

## 2010-08-07 DIAGNOSIS — I059 Rheumatic mitral valve disease, unspecified: Secondary | ICD-10-CM

## 2010-08-07 LAB — CBC WITH DIFFERENTIAL/PLATELET
Basophils Absolute: 0 K/uL (ref 0.0–0.1)
Basophils Relative: 1 % (ref 0–1)
Eosinophils Absolute: 0.1 K/uL (ref 0.0–0.7)
Eosinophils Relative: 2 % (ref 0–5)
HCT: 38.7 % (ref 36.0–46.0)
Hemoglobin: 12.7 g/dL (ref 12.0–15.0)
Lymphocytes Relative: 31 % (ref 12–46)
Lymphs Abs: 2.5 K/uL (ref 0.7–4.0)
MCH: 29.5 pg (ref 26.0–34.0)
MCHC: 32.8 g/dL (ref 30.0–36.0)
MCV: 89.8 fL (ref 78.0–100.0)
Monocytes Absolute: 0.9 K/uL (ref 0.1–1.0)
Monocytes Relative: 11 % (ref 3–12)
Neutro Abs: 4.3 K/uL (ref 1.7–7.7)
Neutrophils Relative %: 55 % (ref 43–77)
Platelets: 244 K/uL (ref 150–400)
RBC: 4.31 MIL/uL (ref 3.87–5.11)
RDW: 13.7 % (ref 11.5–15.5)
WBC: 7.9 K/uL (ref 4.0–10.5)

## 2010-08-07 LAB — BRAIN NATRIURETIC PEPTIDE: Brain Natriuretic Peptide: 99.9 pg/mL (ref 0.0–100.0)

## 2010-08-07 LAB — BASIC METABOLIC PANEL: Sodium: 134 mEq/L — ABNORMAL LOW (ref 135–145)

## 2010-08-07 MED ORDER — POTASSIUM CHLORIDE CRYS ER 20 MEQ PO TBCR: 20.0000 meq | EXTENDED_RELEASE_TABLET | Freq: Every day | ORAL | Status: DC

## 2010-08-07 MED ORDER — FUROSEMIDE 20 MG PO TABS: 20.0000 mg | ORAL_TABLET | Freq: Every day | ORAL | Status: DC

## 2010-08-08 ENCOUNTER — Inpatient Hospital Stay (HOSPITAL_COMMUNITY)
Admission: EM | Admit: 2010-08-08 | Discharge: 2010-08-11 | DRG: 300 | Disposition: A | Discharge status: Home Health | Payer: Medicare Other | Attending: Internal Medicine | Admitting: Internal Medicine

## 2010-08-08 DIAGNOSIS — E871 Hypo-osmolality and hyponatremia: Secondary | ICD-10-CM | POA: Diagnosis present

## 2010-08-08 DIAGNOSIS — I10 Essential (primary) hypertension: Secondary | ICD-10-CM | POA: Diagnosis present

## 2010-08-08 DIAGNOSIS — I2510 Atherosclerotic heart disease of native coronary artery without angina pectoris: Secondary | ICD-10-CM | POA: Diagnosis present

## 2010-08-08 DIAGNOSIS — I447 Left bundle-branch block, unspecified: Secondary | ICD-10-CM | POA: Diagnosis present

## 2010-08-08 DIAGNOSIS — E785 Hyperlipidemia, unspecified: Secondary | ICD-10-CM | POA: Diagnosis present

## 2010-08-08 DIAGNOSIS — I739 Peripheral vascular disease, unspecified: Secondary | ICD-10-CM | POA: Diagnosis present

## 2010-08-08 DIAGNOSIS — Z23 Encounter for immunization: Secondary | ICD-10-CM

## 2010-08-08 DIAGNOSIS — K219 Gastro-esophageal reflux disease without esophagitis: Secondary | ICD-10-CM | POA: Diagnosis present

## 2010-08-08 DIAGNOSIS — I824Y9 Acute embolism and thrombosis of unspecified deep veins of unspecified proximal lower extremity: Principal | ICD-10-CM | POA: Diagnosis present

## 2010-08-08 LAB — CBC
MCH: 30.2 pg (ref 26.0–34.0)
MCHC: 34 g/dL (ref 30.0–36.0)
MCV: 88.9 fL (ref 78.0–100.0)
Platelets: 221 10*3/uL (ref 150–400)
RBC: 3.87 MIL/uL (ref 3.87–5.11)

## 2010-08-08 LAB — BASIC METABOLIC PANEL
BUN: 21 mg/dL (ref 6–23)
CO2: 29 mEq/L (ref 19–32)
Calcium: 9.6 mg/dL (ref 8.4–10.5)
Glucose, Bld: 129 mg/dL — ABNORMAL HIGH (ref 70–99)
Sodium: 130 mEq/L — ABNORMAL LOW (ref 135–145)

## 2010-08-08 LAB — DIFFERENTIAL
Eosinophils Absolute: 0.1 10*3/uL (ref 0.0–0.7)
Eosinophils Relative: 2 % (ref 0–5)
Lymphs Abs: 1.7 10*3/uL (ref 0.7–4.0)
Monocytes Absolute: 0.9 10*3/uL (ref 0.1–1.0)
Monocytes Relative: 13 % — ABNORMAL HIGH (ref 3–12)
Neutrophils Relative %: 63 % (ref 43–77)

## 2010-08-09 LAB — DIFFERENTIAL
Basophils Absolute: 0 10*3/uL (ref 0.0–0.1)
Eosinophils Relative: 2 % (ref 0–5)
Lymphocytes Relative: 31 % (ref 12–46)
Lymphs Abs: 2.2 10*3/uL (ref 0.7–4.0)
Monocytes Absolute: 0.8 10*3/uL (ref 0.1–1.0)

## 2010-08-09 LAB — CBC
HCT: 36.8 % (ref 36.0–46.0)
MCHC: 33.4 g/dL (ref 30.0–36.0)
MCV: 88.7 fL (ref 78.0–100.0)
RDW: 13.4 % (ref 11.5–15.5)

## 2010-08-10 LAB — BASIC METABOLIC PANEL
CO2: 33 mEq/L — ABNORMAL HIGH (ref 19–32)
Chloride: 94 mEq/L — ABNORMAL LOW (ref 96–112)
GFR calc Af Amer: >60 mL/min (ref >60)
Potassium: 3.8 mEq/L (ref 3.5–5.1)
Sodium: 134 mEq/L — ABNORMAL LOW (ref 135–145)

## 2010-08-11 LAB — PROTIME-INR: INR: 1.46 (ref 0.00–1.49)

## 2010-08-12 ENCOUNTER — Ambulatory Visit (INDEPENDENT_AMBULATORY_CARE_PROVIDER_SITE_OTHER): Payer: Medicare Other | Admitting: Cardiology

## 2010-08-12 ENCOUNTER — Encounter: Payer: Self-pay | Admitting: Cardiology

## 2010-08-12 VITALS — BP 122/54 | HR 78 | Ht 59.0 in | Wt 115.0 lb

## 2010-08-12 DIAGNOSIS — I82409 Acute embolism and thrombosis of unspecified deep veins of unspecified lower extremity: Secondary | ICD-10-CM | POA: Insufficient documentation

## 2010-08-12 DIAGNOSIS — R609 Edema, unspecified: Secondary | ICD-10-CM

## 2010-08-12 MED ORDER — POTASSIUM CHLORIDE CRYS ER 20 MEQ PO TBCR: 20.0000 meq | EXTENDED_RELEASE_TABLET | ORAL | Status: DC | PRN

## 2010-08-12 MED ORDER — FUROSEMIDE 20 MG PO TABS: 20.0000 mg | ORAL_TABLET | ORAL | Status: DC | PRN

## 2010-08-12 MED ORDER — ENOXAPARIN SODIUM 80 MG/0.8ML ~~LOC~~ SOLN: 80.0000 mg | SUBCUTANEOUS | Status: DC

## 2010-08-12 NOTE — Discharge Summary (Signed)
  NAMEEVALENA, FUJII                ACCOUNT NO.:  [id number]  MEDICAL RECORD NO.:  [id number]  LOCATION:  A307                          FACILITY:  [facility]  PHYSICIAN:  Shaymus Eveleth L. Juanetta Gosling, M.D.DATE OF BIRTH:  [birth year]  DATE OF ADMISSION:  08/08/2010 DATE OF DISCHARGE:  LH                              DISCHARGE SUMMARY   FINAL DISCHARGE DIAGNOSES: 1. Deep venous thrombosis. 2. Coronary artery occlusive disease, status post stent placement and     bypass grafting. 3. Hyperlipidemia. 4. Peripheral arterial disease. 5. Hypertension. 6. Left bundle-branch block. 7. Gastroesophageal reflux disease. 8. Compression fracture of the lumbar spine.  HISTORY:  Ms. Rothgeb is an 77 year old who has multiple medical problems who had, had a recent problem with a compression fracture in her back. She was unable to ambulate as much as usual, but had spent a lot of time in a chair with her legs hanging down.  She developed swelling of both lower extremities and was found to have a DVT in the right.  Her physical examination on admission showed she was alert and oriented. Blood pressure 163/67, pulse rate was 68, temperature was 97.7, O2 sat was 96%.  Her pupils are reactive.  Nose and throat are clear.  Mucous membranes are moist.  Neck supple without masses.  Her chest was clear. Heart was regular.  She had mild bilateral calf tenderness and minimal edema.  HOSPITAL COURSE:  She was started on Lovenox and improved.  She has multiple family members who have medical training and considering that she is going to be discharged home to continue Lovenox at home.  She will also be on Coumadin at home.  She will be discharged home on Diovan HCT 320/25 daily, amlodipine 10 mg daily, atorvastatin 20 mg daily, Lasix 20 mg daily, aspirin 325 mg daily, multiple vitamins daily, omeprazole 20 mg daily, potassium chloride 20 mEq daily, CoQ10 daily, vitamin B12 daily, vitamin D3 400 units daily, Coumadin 7.5 mg  daily and Lovenox 80 mg subcu daily. She will have daily PT/INR.  She will be followed by the home health nurses and back in my office in about 2 weeks.     Ahmaya Ostermiller L. Juanetta Gosling, M.D.     ELH/MEDQ  D:  08/11/2010  T:  08/11/2010  Job:  [id number]  Electronically Signed by Kari Baars M.D. on 08/12/2010 08:40:33 AM

## 2010-08-12 NOTE — Discharge Summary (Signed)
  NAMEALIVIANA, BURDELL                ACCOUNT NO.:  [id number]  MEDICAL RECORD NO.:  [id number]  LOCATION:  A307                          FACILITY:  [facility]  PHYSICIAN:  Tivis Wherry L. Juanetta Gosling, M.D.DATE OF BIRTH:  04/25/1918  DATE OF ADMISSION:  08/08/2010 DATE OF DISCHARGE:  LH                              DISCHARGE SUMMARY   Ms. Sicard was mistakenly admitted to Dr. Ouida Sills with a DVT.  Dr. Fransico Him has been seeing her over the weekend.  She is doing better, says she feels okay and has no new complaints.  Her physical examination shows that she is awake and alert, looks comfortable.  Temperature is 98.6, pulse 73, respirations 20, blood pressure 163/64, O2 sats 94% on room air.  Her leg looks okay.  She has minimal swelling now.  Her INR is not back yet, but I told I have discussed this with her.  I think she could probably go home for Lovenox at home.  She has a daughter who is a Engineer, civil (consulting) and who can give her the shots and we can get home health involved to check daily PT/INR.  She has DVT.  She has had coronary artery occlusive disease, hyperlipidemia, peripheral arterial disease, hypertension and GERD.  My plan is then for discharge home.     Marshelle Bilger L. Juanetta Gosling, M.D.     ELH/MEDQ  D:  08/11/2010  T:  08/11/2010  Job:  [id number]  Electronically Signed by Kari Baars M.D. on 08/12/2010 08:40:30 AM

## 2010-08-12 NOTE — Patient Instructions (Signed)
**Note De-Identified  Obfuscation** Your physician has recommended you make the following change in your medication: please take 2 extra doses of Lonenox  Your physician recommends that you purchase a recliner so you can elevate legs while resting  Your physician recommends that you schedule a follow-up appointment in: 6 months

## 2010-08-12 NOTE — Progress Notes (Signed)
HPI [name] Returns for close followup of her lower extremity edema and recent diagnosis of DVT. Her INR was 2.9 today. She is on a Lovenox bridge.  Her edema has improved. She denies any calf pain. She is down to 20 mg of Lasix p.r.n.  Recent echocardiogram shows normal left ventricular systolic function with mild aortic stenosis. Carotid Dopplers showed stable plaque. Past Medical History  Diagnosis Date  . Cancer     No past surgical history on file.  No family history on file.  History   Social History  . Marital Status: Widowed    Spouse Name: N/A    Number of Children: 1  . Years of Education: N/A   Occupational History  . retired    Social History Main Topics  . Smoking status: Never Smoker   . Smokeless tobacco: Never Used  . Alcohol Use: No  . Drug Use: No  . Sexually Active: Not on file   Other Topics Concern  . Not on file   Social History Narrative  . No narrative on file    Allergies  Allergen Reactions  . Morphine     Current Outpatient Prescriptions  Medication Sig Dispense Refill  . amLODipine (NORVASC) 10 MG tablet Take 1 tablet (10 mg total) by mouth daily.  90 tablet  0  . aspirin 325 MG tablet Take 325 mg by mouth daily.        Marland Kitchen atorvastatin (LIPITOR) 20 MG tablet Take 1 tablet (20 mg total) by mouth daily.  90 tablet  0  . Cholecalciferol (VITAMIN D) 400 UNITS capsule Take 400 Units by mouth daily.        . Coenzyme Q10 (CO Q 10) 100 MG CAPS Take 1 capsule by mouth daily.        . Cyanocobalamin (VITAMIN B 12 PO) Take 1 tablet by mouth daily.        Marland Kitchen enoxaparin (LOVENOX) 80 MG/0.8ML SOLN Inject 1 mg into the skin daily.        . furosemide (LASIX) 20 MG tablet Take 1 tablet (20 mg total) by mouth as needed. Take 1 tablet po X 3 days  30 tablet  3  . Homeopathic Products ([employer] LEG CRAMPS PAIN RELIEF) TABS Take 1 tablet by mouth daily.        . Multiple Vitamins-Minerals (MULTIVITAMIN WITH MINERALS) tablet Take 1 tablet by mouth daily.         Marland Kitchen omeprazole (PRILOSEC) 20 MG capsule Take 20 mg by mouth daily.        . potassium chloride SA (K-DUR,KLOR-CON) 20 MEQ tablet Take 1 tablet (20 mEq total) by mouth daily. Take 1 tablet daily X3  (take with Furosemide)  3 tablet  0  . valsartan-hydrochlorothiazide (DIOVAN-HCT) 320-25 MG per tablet Take 1 tablet by mouth daily.  90 tablet  0  . DISCONTD: furosemide (LASIX) 20 MG tablet Take 1 tablet (20 mg total) by mouth daily. Take 1 tablet po X 3 days  3 tablet  0    ROS Negative other than HPI.   PE General Appearance: well developed, well nourished in no acute distress HEENT: symmetrical face, PERRLA, good dentition  Neck: no JVD, thyromegaly, or adenopathy, trachea midline Chest: symmetric without deformity Cardiac: PMI non-displaced, RRR, normal S1, S2, no gallop, systolic murmur Lung: clear to ausculation and percussion Vascular: all pulses full without bruits  Abdominal: nondistended, nontender, good bowel sounds, no HSM, no bruits Extremities: Minimal edema, good arterial pulses, no  calf tenderness, no sign of DVT, no varicosities  Skin: normal color, no rashes Neuro: alert and oriented x 3, non-focal Pysch: normal affect Filed Vitals:   08/12/10 1057  BP: 122/54  Pulse: 78  Height: 4\' 11"  (1.499 m)  Weight: 115 lb (52.164 kg)    EKG  Labs and Studies Reviewed.   Lab Results  Component Value Date   WBC 7.0 08/09/2010   HGB 12.3 08/09/2010   HCT 36.8 08/09/2010   MCV 88.7 08/09/2010   PLT 222 08/09/2010      Chemistry      Component Value Date/Time   NA 134* 08/10/2010 0535   K 3.8 08/10/2010 0535   CL 94* 08/10/2010 0535   CO2 33* 08/10/2010 0535   BUN 18 08/10/2010 0535   CREATININE 0.61 08/10/2010 0535   CREATININE 0.74 08/06/2010 1549      Component Value Date/Time   CALCIUM 9.9 08/10/2010 0535       No results found for this basename: CHOL   No results found for this basename: HDL   No results found for this basename: LDLCALC   No results found for this  basename: TRIG   No results found for this basename: CHOLHDL   No results found for this basename: HGBA1C   No results found for this basename: ALT, AST, GGT, ALKPHOS, BILITOT   No results found for this basename: TSH

## 2010-08-25 ENCOUNTER — Other Ambulatory Visit (HOSPITAL_COMMUNITY): Payer: Self-pay | Admitting: Pulmonary Disease

## 2010-08-25 DIAGNOSIS — S32000A Wedge compression fracture of unspecified lumbar vertebra, initial encounter for closed fracture: Secondary | ICD-10-CM

## 2010-08-28 ENCOUNTER — Ambulatory Visit: Payer: Medicare Other | Admitting: Cardiology

## 2010-08-28 ENCOUNTER — Other Ambulatory Visit: Payer: Self-pay

## 2010-08-28 DIAGNOSIS — R609 Edema, unspecified: Secondary | ICD-10-CM

## 2010-08-28 MED ORDER — POTASSIUM CHLORIDE CRYS ER 20 MEQ PO TBCR: 20.0000 meq | EXTENDED_RELEASE_TABLET | Freq: Every day | ORAL | Status: DC

## 2010-08-28 MED ORDER — FUROSEMIDE 20 MG PO TABS: 20.0000 mg | ORAL_TABLET | Freq: Every day | ORAL | Status: DC

## 2010-08-28 NOTE — Progress Notes (Signed)
Per Dr. Daleen Squibb, pt. Is advised to increase Potassium and Lasix daily due to edema in ankles and feet.

## 2010-08-29 ENCOUNTER — Ambulatory Visit (HOSPITAL_COMMUNITY)
Admission: RE | Admit: 2010-08-29 | Discharge: 2010-08-29 | Disposition: A | Discharge status: Home / Self Care | Payer: Medicare Other | Source: Ambulatory Visit | Attending: Pulmonary Disease | Admitting: Pulmonary Disease

## 2010-08-29 DIAGNOSIS — M949 Disorder of cartilage, unspecified: Secondary | ICD-10-CM | POA: Insufficient documentation

## 2010-08-29 DIAGNOSIS — S32000A Wedge compression fracture of unspecified lumbar vertebra, initial encounter for closed fracture: Secondary | ICD-10-CM

## 2010-08-29 DIAGNOSIS — M899 Disorder of bone, unspecified: Secondary | ICD-10-CM | POA: Insufficient documentation

## 2010-09-01 NOTE — H&P (Signed)
Colleen Roberts, Colleen Roberts                ACCOUNT NO.:  [id number]  MEDICAL RECORD NO.:  [id number]           PATIENT TYPE:  I  LOCATION:  A307                          FACILITY:  [facility]  PHYSICIAN:  Colleen Nails, MD DATE OF BIRTH:  1919/01/13  DATE OF ADMISSION:  08/08/2010 DATE OF DISCHARGE:  LH                             HISTORY & PHYSICAL   REASON FOR ADMISSION:  DVT.  HISTORY OF PRESENT ILLNESS:  This is an 77 year old Caucasian female with multiple medical problems including coronary artery disease status post history of several stents placement as well as coronary artery bypass graft, hyperlipidemia, peripheral arterial disease, hypertension, left bundle-branch block, questionable CVA in the remote past, gastroesophageal reflux disease.  The patient was in a Cardiology at [facility] for followup after 1 year of asymptomatic intervals.  During her evaluation, she mentioned over the last 2-2-1/2 weeks, she had been having bilateral lower extremity swelling, which got worse over the last 2 days with some increasing pain and some swelling on bilateral lower extremities.  She was sent to Radiology for lower extremity Doppler study which identified nonocclusive thrombus within the right profunda femoral vein near its junction with the common femoral vein, which was reported to be subacute or chronic in age.  Because of this, she was admitted for initiation of anticoagulation.  Her history also involves recent compression fracture with chronic back pain, for which she wears a brace.  She was seen by myself on the floor, accompanied by her daughter and granddaughter who both are nurses by occupation.  She gives history which is reliable and has been working until today.  No chest pain or shortness of breath.  No cough.  PAST MEDICAL HISTORY:  As above.  Skin cancer.  PAST SURGICAL HISTORY:  Partial thyroidectomy, coronary artery bypass graft, stent placements, rotator cuff repair,  cholecystectomy, partial acromionectomy in the past.  HOME MEDICATIONS: 1. Amlodipine 10 mg daily. 2. Aspirin 325 mg daily. 3. Atorvastatin 20 mg daily. 4. Vitamin D 400 units daily. 5. Coenzyme Q 100 mg daily. 6. Vitamin B12 p.o. 7. Homeopathic productive and multivitamin minerals. 8. Omeprazole 20 mg by mouth daily. 9. Diovan HCT 320/25 mg daily. 10.Lasix 20 mg daily. 11.Potassium chloride 20 mEq daily.  REVIEW OF SYSTEMS:  As in HPI, all the rest have been reviewed and negative.  FAMILY HISTORY:  Noncontributory.  ALLERGIES:  To MORPHINE SULFATE, TRANDOLAPRIL, CONTRAST MEDIA, ACE INHIBITORS, and TRAMADOL.  PHYSICAL EXAMINATION:  GENERAL:  She is alert and oriented x3 in no acute distress. VITAL SIGNS:  Her vitals show blood pressure 163/67, pulse rate 68, temperature 97.7, oxygen saturation 96%. HEENT:  Pink conjunctivae.  Nonicteric sclera, well hydrated. NECK:  Negative for old thyroidectomy scar.  No JVD, no thyromegaly. CHEST:  Clear to auscultation bilaterally. CARDIOVASCULAR:  Normal S1 and S2.  No murmur, no gallop. ABDOMEN:  Soft and nontender. EXTREMITIES:  She has mild bilateral calf tenderness.  No significant pitting edema. CNS:  Nonfocal. SKIN:  Has no rash, no hyperemia.  Her workup include bilateral lower extremity venous duplex ultrasound which showed nonocclusive thrombus within the right profunda  femoral vein near its junction with the common femoral vein, which may be subacute or chronic in age.  No signs of venous thrombus identified.  ASSESSMENT AND PLAN: 1. Nonocclusive thrombus within the right profunda femoral vein.     Risks and benefits of anticoagulation with IV heparin was discussed     with the patient and her family, and I decided against IV     heparin however, we will proceed with treatment with low-     molecular-weight heparin involving Lovenox 50 mg subcu x1 tonight     and pharmacy to assess in the morning.  I will also  initiate     warfarin 3 mg p.o. x1, pharmacy to reassess in the morning with     coagulation studies.  No recent colonoscopy was performed on this     patient and her daughter disputes her past history of stroke.     Also, I would still be careful not to over anticoagulate this     particular patient because it gives risk of bleeding and     complications, long-term anticoagulation to be decided on her     clinical response. 2. Hypertension, uncontrolled.  We will proceed with her oral home     antihypertensive medications. 3. Hyperlipidemia.  We will continue with her Lipitor. 4. Diet and nutrition with low-sodium diet. 5. Code status.  Full code. 6. Disposition.  To be determined, likely discharge to home.                                           ______________________________ Colleen Nails, MD     GN/MEDQ  D:  08/08/2010  T:  08/09/2010  Job:  [id number]  Electronically Signed by Colleen Nails MD on 09/01/2010 10:22:50 AM

## 2010-09-02 ENCOUNTER — Ambulatory Visit (INDEPENDENT_AMBULATORY_CARE_PROVIDER_SITE_OTHER): Payer: Self-pay | Admitting: *Deleted

## 2010-09-02 DIAGNOSIS — I82409 Acute embolism and thrombosis of unspecified deep veins of unspecified lower extremity: Secondary | ICD-10-CM

## 2010-09-02 DIAGNOSIS — Z7901 Long term (current) use of anticoagulants: Secondary | ICD-10-CM

## 2010-09-02 DIAGNOSIS — R0989 Other specified symptoms and signs involving the circulatory and respiratory systems: Secondary | ICD-10-CM

## 2010-09-02 LAB — POCT INR: INR: 2.2

## 2010-09-11 ENCOUNTER — Telehealth: Payer: Self-pay | Admitting: *Deleted

## 2010-09-11 ENCOUNTER — Ambulatory Visit: Payer: Self-pay | Admitting: *Deleted

## 2010-09-11 DIAGNOSIS — Z7901 Long term (current) use of anticoagulants: Secondary | ICD-10-CM

## 2010-09-11 DIAGNOSIS — I82409 Acute embolism and thrombosis of unspecified deep veins of unspecified lower extremity: Secondary | ICD-10-CM

## 2010-09-11 NOTE — Telephone Encounter (Signed)
PT - 15.0 / INR - 1.3 / Patient taking 2.5mg  on M, W, F and 5mg  all other days / pls call Cathy with instructions/tg

## 2010-09-11 NOTE — Telephone Encounter (Signed)
See coumadin note. 

## 2010-09-15 ENCOUNTER — Ambulatory Visit: Payer: Self-pay | Admitting: *Deleted

## 2010-09-15 ENCOUNTER — Telehealth: Payer: Self-pay | Admitting: Cardiology

## 2010-09-15 DIAGNOSIS — I82409 Acute embolism and thrombosis of unspecified deep veins of unspecified lower extremity: Secondary | ICD-10-CM

## 2010-09-15 DIAGNOSIS — Z7901 Long term (current) use of anticoagulants: Secondary | ICD-10-CM

## 2010-09-15 LAB — POCT INR: INR: 1.7

## 2010-09-15 NOTE — Telephone Encounter (Signed)
See coumadin note. 

## 2010-09-15 NOTE — Telephone Encounter (Signed)
PT - 20.9 / INR - 1.7 / taking 5mg  QD / has 5 mg tabs / HHN to discharge today / will need appt for next PT/INR / tg

## 2010-09-16 ENCOUNTER — Telehealth: Payer: Self-pay | Admitting: Cardiology

## 2010-09-16 MED ORDER — AMLODIPINE BESYLATE 10 MG PO TABS: 10.0000 mg | ORAL_TABLET | Freq: Every day | ORAL | Status: DC

## 2010-09-16 MED ORDER — ATORVASTATIN CALCIUM 20 MG PO TABS: 20.0000 mg | ORAL_TABLET | Freq: Every day | ORAL | Status: DC

## 2010-09-16 MED ORDER — VALSARTAN-HYDROCHLOROTHIAZIDE 320-25 MG PO TABS: 1.0000 | ORAL_TABLET | Freq: Every day | ORAL | Status: DC

## 2010-09-16 NOTE — Telephone Encounter (Signed)
.   Requested Prescriptions   Pending Prescriptions Disp Refills  . amLODipine (NORVASC) 10 MG tablet 90 tablet 0    Sig: Take 1 tablet (10 mg total) by mouth daily.  . valsartan-hydrochlorothiazide (DIOVAN-HCT) 320-25 MG per tablet 90 tablet 0    Sig: Take 1 tablet by mouth daily.  Marland Kitchen atorvastatin (LIPITOR) 20 MG tablet 90 tablet 0    Sig: Take 1 tablet (20 mg total) by mouth daily.   Pt would like a 3 mth supply.

## 2010-09-24 ENCOUNTER — Telehealth: Payer: Self-pay | Admitting: Cardiology

## 2010-09-24 ENCOUNTER — Other Ambulatory Visit: Payer: Self-pay

## 2010-09-24 MED ORDER — ATORVASTATIN CALCIUM 20 MG PO TABS: 20.0000 mg | ORAL_TABLET | Freq: Every day | ORAL | Status: DC

## 2010-09-24 MED ORDER — VALSARTAN-HYDROCHLOROTHIAZIDE 320-25 MG PO TABS: 1.0000 | ORAL_TABLET | Freq: Every day | ORAL | Status: DC

## 2010-09-24 MED ORDER — AMLODIPINE BESYLATE 10 MG PO TABS: 10.0000 mg | ORAL_TABLET | Freq: Every day | ORAL | Status: DC

## 2010-09-24 NOTE — Telephone Encounter (Signed)
Patient needs Diovan, Amlodopine, Atorvastatin sent to [facility] / tg

## 2010-09-25 ENCOUNTER — Ambulatory Visit (INDEPENDENT_AMBULATORY_CARE_PROVIDER_SITE_OTHER): Payer: Medicare Other | Admitting: *Deleted

## 2010-09-25 DIAGNOSIS — I82409 Acute embolism and thrombosis of unspecified deep veins of unspecified lower extremity: Secondary | ICD-10-CM

## 2010-09-25 DIAGNOSIS — Z7901 Long term (current) use of anticoagulants: Secondary | ICD-10-CM

## 2010-09-25 LAB — POCT INR: INR: 1

## 2010-09-29 ENCOUNTER — Ambulatory Visit (INDEPENDENT_AMBULATORY_CARE_PROVIDER_SITE_OTHER): Payer: Medicare Other | Admitting: *Deleted

## 2010-09-29 DIAGNOSIS — Z7901 Long term (current) use of anticoagulants: Secondary | ICD-10-CM

## 2010-09-29 DIAGNOSIS — I82409 Acute embolism and thrombosis of unspecified deep veins of unspecified lower extremity: Secondary | ICD-10-CM

## 2010-10-01 ENCOUNTER — Encounter: Payer: Medicare Other | Admitting: *Deleted

## 2010-10-02 ENCOUNTER — Ambulatory Visit (INDEPENDENT_AMBULATORY_CARE_PROVIDER_SITE_OTHER): Payer: Medicare Other | Admitting: *Deleted

## 2010-10-02 DIAGNOSIS — Z7901 Long term (current) use of anticoagulants: Secondary | ICD-10-CM

## 2010-10-02 DIAGNOSIS — I82409 Acute embolism and thrombosis of unspecified deep veins of unspecified lower extremity: Secondary | ICD-10-CM

## 2010-10-08 ENCOUNTER — Ambulatory Visit (INDEPENDENT_AMBULATORY_CARE_PROVIDER_SITE_OTHER): Payer: Medicare Other | Admitting: *Deleted

## 2010-10-08 DIAGNOSIS — Z7901 Long term (current) use of anticoagulants: Secondary | ICD-10-CM

## 2010-10-08 DIAGNOSIS — I82409 Acute embolism and thrombosis of unspecified deep veins of unspecified lower extremity: Secondary | ICD-10-CM

## 2010-10-16 ENCOUNTER — Ambulatory Visit (INDEPENDENT_AMBULATORY_CARE_PROVIDER_SITE_OTHER): Payer: Medicare Other | Admitting: *Deleted

## 2010-10-16 DIAGNOSIS — I82409 Acute embolism and thrombosis of unspecified deep veins of unspecified lower extremity: Secondary | ICD-10-CM

## 2010-10-16 DIAGNOSIS — Z7901 Long term (current) use of anticoagulants: Secondary | ICD-10-CM

## 2010-10-16 LAB — POCT INR: INR: 2

## 2010-10-30 ENCOUNTER — Ambulatory Visit (INDEPENDENT_AMBULATORY_CARE_PROVIDER_SITE_OTHER): Payer: Medicare Other | Admitting: *Deleted

## 2010-10-30 DIAGNOSIS — I82409 Acute embolism and thrombosis of unspecified deep veins of unspecified lower extremity: Secondary | ICD-10-CM

## 2010-10-30 DIAGNOSIS — Z7901 Long term (current) use of anticoagulants: Secondary | ICD-10-CM

## 2010-11-17 ENCOUNTER — Ambulatory Visit (INDEPENDENT_AMBULATORY_CARE_PROVIDER_SITE_OTHER): Payer: Medicare Other | Admitting: *Deleted

## 2010-11-17 DIAGNOSIS — I82409 Acute embolism and thrombosis of unspecified deep veins of unspecified lower extremity: Secondary | ICD-10-CM

## 2010-11-17 DIAGNOSIS — Z7901 Long term (current) use of anticoagulants: Secondary | ICD-10-CM

## 2010-12-08 ENCOUNTER — Ambulatory Visit (INDEPENDENT_AMBULATORY_CARE_PROVIDER_SITE_OTHER): Payer: Medicare Other | Admitting: *Deleted

## 2010-12-08 DIAGNOSIS — Z7901 Long term (current) use of anticoagulants: Secondary | ICD-10-CM

## 2010-12-08 DIAGNOSIS — I82409 Acute embolism and thrombosis of unspecified deep veins of unspecified lower extremity: Secondary | ICD-10-CM

## 2010-12-08 LAB — POCT INR: INR: 2.1

## 2010-12-30 ENCOUNTER — Other Ambulatory Visit: Payer: Self-pay | Admitting: Cardiology

## 2010-12-31 ENCOUNTER — Other Ambulatory Visit: Payer: Self-pay | Admitting: *Deleted

## 2010-12-31 MED ORDER — ATORVASTATIN CALCIUM 20 MG PO TABS: 20.0000 mg | ORAL_TABLET | Freq: Every day | ORAL | Status: DC

## 2011-01-05 ENCOUNTER — Ambulatory Visit (INDEPENDENT_AMBULATORY_CARE_PROVIDER_SITE_OTHER): Payer: Medicare Other | Admitting: *Deleted

## 2011-01-05 DIAGNOSIS — Z7901 Long term (current) use of anticoagulants: Secondary | ICD-10-CM

## 2011-01-05 DIAGNOSIS — I82409 Acute embolism and thrombosis of unspecified deep veins of unspecified lower extremity: Secondary | ICD-10-CM

## 2011-01-05 LAB — POCT INR: INR: 2

## 2011-01-30 ENCOUNTER — Other Ambulatory Visit: Payer: Self-pay | Admitting: Cardiology

## 2011-02-02 ENCOUNTER — Ambulatory Visit (INDEPENDENT_AMBULATORY_CARE_PROVIDER_SITE_OTHER): Payer: Medicare Other | Admitting: *Deleted

## 2011-02-02 DIAGNOSIS — Z7901 Long term (current) use of anticoagulants: Secondary | ICD-10-CM

## 2011-02-02 DIAGNOSIS — I82409 Acute embolism and thrombosis of unspecified deep veins of unspecified lower extremity: Secondary | ICD-10-CM

## 2011-02-18 ENCOUNTER — Encounter: Payer: Self-pay | Admitting: Cardiology

## 2011-02-18 ENCOUNTER — Ambulatory Visit (INDEPENDENT_AMBULATORY_CARE_PROVIDER_SITE_OTHER): Payer: Medicare Other | Admitting: Cardiology

## 2011-02-18 VITALS — BP 145/63 | HR 74 | Wt 117.0 lb

## 2011-02-18 DIAGNOSIS — Z7901 Long term (current) use of anticoagulants: Secondary | ICD-10-CM

## 2011-02-18 DIAGNOSIS — I447 Left bundle-branch block, unspecified: Secondary | ICD-10-CM

## 2011-02-18 DIAGNOSIS — I82409 Acute embolism and thrombosis of unspecified deep veins of unspecified lower extremity: Secondary | ICD-10-CM

## 2011-02-18 DIAGNOSIS — I10 Essential (primary) hypertension: Secondary | ICD-10-CM

## 2011-02-18 DIAGNOSIS — I2510 Atherosclerotic heart disease of native coronary artery without angina pectoris: Secondary | ICD-10-CM

## 2011-02-18 DIAGNOSIS — I6529 Occlusion and stenosis of unspecified carotid artery: Secondary | ICD-10-CM

## 2011-02-18 NOTE — Assessment & Plan Note (Signed)
Stable. No change in treatment. 

## 2011-02-18 NOTE — Assessment & Plan Note (Signed)
Resolved. Discontinue Coumadin. Patient does remain active, elevate legs when sitting down, and not to cross legs.

## 2011-02-18 NOTE — Progress Notes (Signed)
HPI  Colleen Roberts comes in today for followup of her history of DVT and anticoagulation, coronary disease history of carotid disease.  Well and is much more mobile now recovered from her compression fracture this summer. Complains of easy bruisability. She's been on Coumadin for 6 months.  She's had no swelling in her legs. She denies any pain. Repeat venous Doppler showed resolution of clot.  He said no angina or ischemic symptoms. She does have dyspnea on exertion. Her carotid disease is stable. Past Medical History  Diagnosis Date  . Cancer     Current Outpatient Prescriptions  Medication Sig Dispense Refill  . atorvastatin (LIPITOR) 20 MG tablet Take 1 tablet (20 mg total) by mouth daily.  90 tablet  1  . Cholecalciferol (VITAMIN D) 400 UNITS capsule Take 400 Units by mouth daily.        . Coenzyme Q10 (CO Q 10) 100 MG CAPS Take 1 capsule by mouth daily.        . Cyanocobalamin (VITAMIN B 12 PO) Take 1 tablet by mouth daily.        Marland Kitchen DIOVAN HCT 320-25 MG per tablet TAKE 1 TABLET BY MOUTH ONCE A DAY.  30 each  2  . Homeopathic Products ([employer] LEG CRAMPS PAIN RELIEF) TABS Take 1 tablet by mouth daily.        Marland Kitchen K-DUR 20 MEQ tablet TAKE 1 TABLET BY MOUTH DAILY WITH FUROSEMIDE.  30 each  1  . Multiple Vitamins-Minerals (MULTIVITAMIN WITH MINERALS) tablet Take 1 tablet by mouth daily.        . NORVASC 10 MG tablet TAKE 1 TABLET BY MOUTH ONCE A DAY.  30 each  1  . omeprazole (PRILOSEC) 20 MG capsule Take 20 mg by mouth daily.        Marland Kitchen torsemide (DEMADEX) 20 MG tablet Take 20 mg by mouth daily.        Marland Kitchen warfarin (COUMADIN) 5 MG tablet Take by mouth as directed.          Allergies  Allergen Reactions  . Morphine     No family history on file.  History   Social History  . Marital Status: Widowed    Spouse Name: N/A    Number of Children: 1  . Years of Education: N/A   Occupational History  . retired    Social History Main Topics  . Smoking status: Never Smoker   . Smokeless  tobacco: Never Used  . Alcohol Use: No  . Drug Use: No  . Sexually Active: Not on file   Other Topics Concern  . Not on file   Social History Narrative  . No narrative on file    ROS ALL NEGATIVE EXCEPT THOSE NOTED IN HPI  PE  General Appearance: well developed, well nourished in no acute distress HEENT: symmetrical face, PERRLA, good dentition  Neck: no JVD, thyromegaly, or adenopathy, trachea midline Chest: symmetric without deformity Cardiac: PMI non-displaced, RRR, normal S1, S2, no gallop or murmur Lung: clear to ausculation and percussion Vascular:vital carotid bruits right greater than left Abdominal: nondistended, nontender, good bowel sounds, no HSM, no bruits Extremities: no cyanosis, clubbing or edema, no sign of DVT, no varicosities  Skin: normal color, no rashes Neuro: alert and oriented x 3, non-focal Pysch: normal affect  EKG Sinus rhythm, left bundle branch block, no change BMET    Component Value Date/Time   NA 134* 08/10/2010 0535   K 3.8 08/10/2010 0535   CL 94* 08/10/2010  0535   CO2 33* 08/10/2010 0535   GLUCOSE 126* 08/10/2010 0535   BUN 18 08/10/2010 0535   CREATININE 0.61 08/10/2010 0535   CREATININE 0.74 08/06/2010 1549   CALCIUM 9.9 08/10/2010 0535   GFRNONAA >60 08/10/2010 0535   GFRAA  Value: >60        The eGFR has been calculated using the MDRD equation. This calculation has not been validated in all clinical situations. eGFR's persistently <60 mL/min signify possible Chronic Kidney Disease. 08/10/2010 0535    Lipid Panel  No results found for this basename: chol, trig, hdl, cholhdl, vldl, ldlcalc    CBC    Component Value Date/Time   WBC 7.0 08/09/2010 0640   RBC 4.15 08/09/2010 0640   HGB 12.3 08/09/2010 0640   HCT 36.8 08/09/2010 0640   PLT 222 08/09/2010 0640   MCV 88.7 08/09/2010 0640   MCH 29.6 08/09/2010 0640   MCHC 33.4 08/09/2010 0640   RDW 13.4 08/09/2010 0640   LYMPHSABS 2.2 08/09/2010 0640   MONOABS 0.8 08/09/2010 0640   EOSABS 0.1 08/09/2010 0640    BASOSABS 0.0 08/09/2010 0640

## 2011-02-18 NOTE — Assessment & Plan Note (Signed)
Carotid Dopplers relatively stable last year. Medical therapy only at this point.

## 2011-02-18 NOTE — Patient Instructions (Signed)
Your physician recommends that you schedule a follow-up appointment in: 3 months   Your physician has recommended you make the following change in your medication: STOP Coumadin

## 2011-02-19 ENCOUNTER — Ambulatory Visit: Payer: Self-pay | Admitting: *Deleted

## 2011-03-09 ENCOUNTER — Encounter: Payer: Medicare Other | Admitting: *Deleted

## 2011-03-09 ENCOUNTER — Telehealth: Payer: Self-pay | Admitting: Cardiology

## 2011-03-09 ENCOUNTER — Ambulatory Visit: Payer: Self-pay | Admitting: *Deleted

## 2011-03-09 NOTE — Telephone Encounter (Signed)
FYI PER PT WAS TAKEN OFF COUMADIN BY DR WALL/TMJ

## 2011-03-09 NOTE — Telephone Encounter (Signed)
Removed from coumadin clinic

## 2011-04-01 ENCOUNTER — Other Ambulatory Visit: Payer: Self-pay | Admitting: Cardiology

## 2011-04-30 ENCOUNTER — Other Ambulatory Visit: Payer: Self-pay | Admitting: *Deleted

## 2011-04-30 MED ORDER — POTASSIUM CHLORIDE CRYS ER 20 MEQ PO TBCR: 20.0000 meq | EXTENDED_RELEASE_TABLET | Freq: Every day | ORAL | Status: DC

## 2011-04-30 MED ORDER — ATORVASTATIN CALCIUM 20 MG PO TABS: 20.0000 mg | ORAL_TABLET | Freq: Every day | ORAL | Status: DC

## 2011-05-29 ENCOUNTER — Encounter: Payer: Self-pay | Admitting: Cardiology

## 2011-05-29 ENCOUNTER — Ambulatory Visit (INDEPENDENT_AMBULATORY_CARE_PROVIDER_SITE_OTHER): Payer: Medicare Other | Admitting: Cardiology

## 2011-05-29 VITALS — BP 156/63 | HR 75 | Resp 16 | Wt 118.0 lb

## 2011-05-29 DIAGNOSIS — I739 Peripheral vascular disease, unspecified: Secondary | ICD-10-CM

## 2011-05-29 DIAGNOSIS — I447 Left bundle-branch block, unspecified: Secondary | ICD-10-CM

## 2011-05-29 DIAGNOSIS — E785 Hyperlipidemia, unspecified: Secondary | ICD-10-CM

## 2011-05-29 DIAGNOSIS — I6529 Occlusion and stenosis of unspecified carotid artery: Secondary | ICD-10-CM

## 2011-05-29 DIAGNOSIS — I2510 Atherosclerotic heart disease of native coronary artery without angina pectoris: Secondary | ICD-10-CM

## 2011-05-29 DIAGNOSIS — I10 Essential (primary) hypertension: Secondary | ICD-10-CM

## 2011-05-29 NOTE — Assessment & Plan Note (Signed)
Doing remarkably well. No change in treatment. Continue  secondary preventive therapy.

## 2011-05-29 NOTE — Progress Notes (Signed)
HPI Ms. Brazil returns today for followup of her coronary artery disease history of bypass surgery.  She still lives alone and drives. Shortly 89 this year. She denies angina or chest pain. She has not fallen.  She is compliant with her medications. Blood pressure Has been well controlled.  Past Medical History  Diagnosis Date  . Cancer     Current Outpatient Prescriptions  Medication Sig Dispense Refill  . aspirin 81 MG chewable tablet Chew 81 mg by mouth daily.        Marland Kitchen atorvastatin (LIPITOR) 20 MG tablet Take 1 tablet (20 mg total) by mouth daily.  90 tablet  1  . bumetanide (BUMEX) 0.5 MG tablet Take 0.5 mg by mouth daily.      . Cholecalciferol (VITAMIN D) 400 UNITS capsule Take 400 Units by mouth daily.        . Coenzyme Q10 (CO Q 10) 100 MG CAPS Take 1 capsule by mouth daily.        . Cyanocobalamin (VITAMIN B 12 PO) Take 1 tablet by mouth daily.        Marland Kitchen DIOVAN HCT 320-25 MG per tablet TAKE 1 TABLET BY MOUTH ONCE A DAY.  30 each  5  . Homeopathic Products ([employer] LEG CRAMPS PAIN RELIEF) TABS Take 1 tablet by mouth daily.        . Multiple Vitamins-Minerals (MULTIVITAMIN WITH MINERALS) tablet Take 1 tablet by mouth daily.        . NORVASC 10 MG tablet TAKE 1 TABLET BY MOUTH ONCE A DAY.  30 each  5  . omeprazole (PRILOSEC) 20 MG capsule Take 20 mg by mouth daily.        . potassium chloride SA (K-DUR,KLOR-CON) 20 MEQ tablet Take 1 tablet (20 mEq total) by mouth daily.  90 tablet  1    Allergies  Allergen Reactions  . Morphine     No family history on file.  History   Social History  . Marital Status: Widowed    Spouse Name: N/A    Number of Children: 1  . Years of Education: N/A   Occupational History  . retired    Social History Main Topics  . Smoking status: Never Smoker   . Smokeless tobacco: Never Used  . Alcohol Use: No  . Drug Use: No  . Sexually Active: Not on file   Other Topics Concern  . Not on file   Social History Narrative  . No narrative on  file    ROS ALL NEGATIVE EXCEPT THOSE NOTED IN HPI  PE  General Appearance: well developed, well nourished in no acute distress, looks much his stated age HEENT: symmetrical face, PERRLA, good dentition  Neck: no JVD, thyromegaly, or adenopathy, trachea midline Chest: symmetric without deformity Cardiac: PMI non-displaced, RRR, normal S1, S2, no gallop or murmur Lung: clear to ausculation and percussion Vascular: all pulses full without bruits  Abdominal: nondistended, nontender, good bowel sounds, no HSM, no bruits Extremities: no cyanosis, clubbing or edema, no sign of DVT, no varicosities  Skin: normal color, no rashes Neuro: alert and oriented x 3, non-focal Pysch: normal affect  EKG  BMET    Component Value Date/Time   NA 134* 08/10/2010 0535   K 3.8 08/10/2010 0535   CL 94* 08/10/2010 0535   CO2 33* 08/10/2010 0535   GLUCOSE 126* 08/10/2010 0535   BUN 18 08/10/2010 0535   CREATININE 0.61 08/10/2010 0535   CREATININE 0.74 08/06/2010 1549   CALCIUM  9.9 08/10/2010 0535   GFRNONAA >60 08/10/2010 0535   GFRAA  Value: >60        The eGFR has been calculated using the MDRD equation. This calculation has not been validated in all clinical situations. eGFR's persistently <60 mL/min signify possible Chronic Kidney Disease. 08/10/2010 0535    Lipid Panel  No results found for this basename: chol, trig, hdl, cholhdl, vldl, ldlcalc    CBC    Component Value Date/Time   WBC 7.0 08/09/2010 0640   RBC 4.15 08/09/2010 0640   HGB 12.3 08/09/2010 0640   HCT 36.8 08/09/2010 0640   PLT 222 08/09/2010 0640   MCV 88.7 08/09/2010 0640   MCH 29.6 08/09/2010 0640   MCHC 33.4 08/09/2010 0640   RDW 13.4 08/09/2010 0640   LYMPHSABS 2.2 08/09/2010 0640   MONOABS 0.8 08/09/2010 0640   EOSABS 0.1 08/09/2010 0640   BASOSABS 0.0 08/09/2010 0640

## 2011-05-29 NOTE — Patient Instructions (Signed)
**Note De-identified  Obfuscation** Your physician recommends that you continue on your current medications as directed. Please refer to the Current Medication list given to you today.  Your physician recommends that you schedule a follow-up appointment in: 6 months  

## 2011-05-29 NOTE — Assessment & Plan Note (Signed)
Stable. No change in treatment. 

## 2011-05-29 NOTE — Assessment & Plan Note (Signed)
Stable without any symptoms of TIAs. Continue secondary preventive therapy. May repeat carotid Dopplers next visit.

## 2011-06-29 ENCOUNTER — Ambulatory Visit (HOSPITAL_COMMUNITY)
Admission: RE | Admit: 2011-06-29 | Discharge: 2011-06-29 | Disposition: A | Discharge status: Home / Self Care | Payer: Medicare Other | Source: Ambulatory Visit | Attending: Pulmonary Disease | Admitting: Pulmonary Disease

## 2011-06-29 ENCOUNTER — Other Ambulatory Visit (HOSPITAL_COMMUNITY): Payer: Self-pay | Admitting: Pulmonary Disease

## 2011-06-29 DIAGNOSIS — Z139 Encounter for screening, unspecified: Secondary | ICD-10-CM

## 2011-06-29 DIAGNOSIS — J40 Bronchitis, not specified as acute or chronic: Secondary | ICD-10-CM

## 2011-08-29 ENCOUNTER — Other Ambulatory Visit: Payer: Self-pay | Admitting: Cardiology

## 2011-09-29 ENCOUNTER — Other Ambulatory Visit: Payer: Self-pay | Admitting: *Deleted

## 2011-09-29 MED ORDER — AMLODIPINE BESYLATE 10 MG PO TABS: 10.0000 mg | ORAL_TABLET | Freq: Every day | ORAL | Status: DC

## 2011-09-29 MED ORDER — VALSARTAN-HYDROCHLOROTHIAZIDE 320-25 MG PO TABS: 1.0000 | ORAL_TABLET | Freq: Every day | ORAL | Status: DC

## 2011-10-27 ENCOUNTER — Encounter: Payer: Self-pay | Admitting: Cardiology

## 2011-10-27 ENCOUNTER — Ambulatory Visit (INDEPENDENT_AMBULATORY_CARE_PROVIDER_SITE_OTHER): Payer: Medicare Other | Admitting: Cardiology

## 2011-10-27 VITALS — BP 176/70 | HR 74 | Wt 117.0 lb

## 2011-10-27 DIAGNOSIS — I6529 Occlusion and stenosis of unspecified carotid artery: Secondary | ICD-10-CM

## 2011-10-27 DIAGNOSIS — I2510 Atherosclerotic heart disease of native coronary artery without angina pectoris: Secondary | ICD-10-CM

## 2011-10-27 DIAGNOSIS — I447 Left bundle-branch block, unspecified: Secondary | ICD-10-CM

## 2011-10-27 DIAGNOSIS — I10 Essential (primary) hypertension: Secondary | ICD-10-CM

## 2011-10-27 DIAGNOSIS — E785 Hyperlipidemia, unspecified: Secondary | ICD-10-CM

## 2011-10-27 NOTE — Assessment & Plan Note (Signed)
She is currently stable with her coronary disease. Continue secondary preventative therapy.

## 2011-10-27 NOTE — Progress Notes (Signed)
HPI Colleen Roberts comes in today for the evaluation and management of her history of coronary artery disease.  She denies any angina or ischemic symptoms. She's been plagued by chronic bronchitis which was evaluated by recent x-ray.   She denies orthopnea, PND, palpitations or edema. She denies any symptoms of TIAs or mini strokes.  Past Medical History  Diagnosis Date  . Cancer     Current Outpatient Prescriptions  Medication Sig Dispense Refill  . amLODipine (NORVASC) 10 MG tablet Take 1 tablet (10 mg total) by mouth daily.  30 tablet  5  . aspirin 81 MG chewable tablet Chew 81 mg by mouth daily.        . bumetanide (BUMEX) 0.5 MG tablet Take 0.5 mg by mouth daily.      . Cholecalciferol (VITAMIN D) 400 UNITS capsule Take 400 Units by mouth daily.        . Coenzyme Q10 (CO Q 10) 100 MG CAPS Take 1 capsule by mouth daily.        . Cyanocobalamin (VITAMIN B 12 PO) Take 1 tablet by mouth daily.        . Homeopathic Products ([employer] LEG CRAMPS PAIN RELIEF) TABS Take 1 tablet by mouth daily.        Marland Kitchen LIPITOR 20 MG tablet TAKE (1) TABLET BY MOUTH AT BEDTIME.  90 each  3  . Multiple Vitamins-Minerals (MULTIVITAMIN WITH MINERALS) tablet Take 1 tablet by mouth daily.        Marland Kitchen omeprazole (PRILOSEC) 20 MG capsule Take 20 mg by mouth daily.        . potassium chloride SA (K-DUR,KLOR-CON) 20 MEQ tablet Take 1 tablet (20 mEq total) by mouth daily.  90 tablet  1  . valsartan-hydrochlorothiazide (DIOVAN HCT) 320-25 MG per tablet Take 1 tablet by mouth daily.  30 tablet  5  . DISCONTD: torsemide (DEMADEX) 20 MG tablet Take 20 mg by mouth daily.          Allergies  Allergen Reactions  . Morphine     No family history on file.  History   Social History  . Marital Status: Widowed    Spouse Name: N/A    Number of Children: 1  . Years of Education: N/A   Occupational History  . retired    Social History Main Topics  . Smoking status: Never Smoker   . Smokeless tobacco: Never Used  . Alcohol  Use: No  . Drug Use: No  . Sexually Active: Not on file   Other Topics Concern  . Not on file   Social History Narrative  . No narrative on file    ROS ALL NEGATIVE EXCEPT THOSE NOTED IN HPI  PE  General Appearance: well developed, well nourished in no acute distress HEENT: symmetrical face, PERRLA, good dentition  Neck: no JVD, thyromegaly, or adenopathy, trachea midline Chest: symmetric without deformity Cardiac: PMI non-displaced, RRR, normal S1, S2, no gallop or murmur Lung: clear to ausculation and percussion Vascular: Right carotid bruit, diminished pulses in the lower extremities but present.  Abdominal: nondistended, nontender, good bowel sounds, no HSM, no bruits Extremities: no cyanosis, clubbing or edema, no sign of DVT, no varicosities  Skin: normal color, no rashes Neuro: alert and oriented x 3, non-focal Pysch: normal affect  EKG Not repeated BMET    Component Value Date/Time   NA 134* 08/10/2010 0535   K 3.8 08/10/2010 0535   CL 94* 08/10/2010 0535   CO2 33* 08/10/2010 0535  GLUCOSE 126* 08/10/2010 0535   BUN 18 08/10/2010 0535   CREATININE 0.61 08/10/2010 0535   CREATININE 0.74 08/06/2010 1549   CALCIUM 9.9 08/10/2010 0535   GFRNONAA >60 08/10/2010 0535   GFRAA  Value: >60        The eGFR has been calculated using the MDRD equation. This calculation has not been validated in all clinical situations. eGFR's persistently <60 mL/min signify possible Chronic Kidney Disease. 08/10/2010 0535    Lipid Panel  No results found for this basename: chol, trig, hdl, cholhdl, vldl, ldlcalc    CBC    Component Value Date/Time   WBC 7.0 08/09/2010 0640   RBC 4.15 08/09/2010 0640   HGB 12.3 08/09/2010 0640   HCT 36.8 08/09/2010 0640   PLT 222 08/09/2010 0640   MCV 88.7 08/09/2010 0640   MCH 29.6 08/09/2010 0640   MCHC 33.4 08/09/2010 0640   RDW 13.4 08/09/2010 0640   LYMPHSABS 2.2 08/09/2010 0640   MONOABS 0.8 08/09/2010 0640   EOSABS 0.1 08/09/2010 0640   BASOSABS 0.0 08/09/2010 0640

## 2011-10-27 NOTE — Assessment & Plan Note (Signed)
Suboptimal control. Continue current medical therapy. She becomes symptomatic with presyncope when we pushed too hard.

## 2011-10-27 NOTE — Patient Instructions (Addendum)
Your physician recommends that you schedule a follow-up appointment in: 1 year  

## 2011-10-27 NOTE — Assessment & Plan Note (Signed)
Stable clinically. Last carotid Dopplers 2011. We'll treat medically with secondary prevention. I would not consider her an operative candidate.

## 2012-03-25 ENCOUNTER — Telehealth: Payer: Self-pay | Admitting: *Deleted

## 2012-03-25 NOTE — Telephone Encounter (Signed)
Valsartan/HCTZ  320/25mg   30 tablets with 11 refills  called in to [facility]   Mylo Red RN

## 2012-03-28 ENCOUNTER — Other Ambulatory Visit: Payer: Self-pay

## 2012-03-28 MED ORDER — AMLODIPINE BESYLATE 10 MG PO TABS: 10.0000 mg | ORAL_TABLET | Freq: Every day | ORAL | Status: DC

## 2012-03-29 ENCOUNTER — Other Ambulatory Visit: Payer: Self-pay | Admitting: Cardiology

## 2012-03-29 MED ORDER — POTASSIUM CHLORIDE CRYS ER 20 MEQ PO TBCR: 20.0000 meq | EXTENDED_RELEASE_TABLET | Freq: Every day | ORAL | Status: DC

## 2012-09-27 ENCOUNTER — Other Ambulatory Visit: Payer: Self-pay | Admitting: *Deleted

## 2012-09-27 MED ORDER — AMLODIPINE BESYLATE 10 MG PO TABS: 10.0000 mg | ORAL_TABLET | Freq: Every day | ORAL | Status: DC

## 2013-01-05 ENCOUNTER — Encounter: Payer: Medicare Other | Admitting: Cardiology

## 2013-01-05 NOTE — Progress Notes (Signed)
Clinical Summary Colleen Roberts is a 79 y.o.female  1. CAD - prior CABG 05/1999 4 vessel (LIMA-LAD, SVG to diag, SVG to OM, SVG to RCA  2. Carotid stenosis  3. HTN   4. PAD - 11/2001 PTA and stenting to the right SFA due to claudication. She had a prior stent to the SFA 04/2001    Past Medical History  Diagnosis Date  . Cancer      Allergies  Allergen Reactions  . Morphine      Current Outpatient Prescriptions  Medication Sig Dispense Refill  . amLODipine (NORVASC) 10 MG tablet Take 1 tablet (10 mg total) by mouth daily.  30 tablet  3  . aspirin 81 MG chewable tablet Chew 81 mg by mouth daily.        . bumetanide (BUMEX) 0.5 MG tablet Take 0.5 mg by mouth daily.      . Cholecalciferol (VITAMIN D) 400 UNITS capsule Take 400 Units by mouth daily.        . Coenzyme Q10 (CO Q 10) 100 MG CAPS Take 1 capsule by mouth daily.        . Cyanocobalamin (VITAMIN B 12 PO) Take 1 tablet by mouth daily.        . Homeopathic Products ([employer] LEG CRAMPS PAIN RELIEF) TABS Take 1 tablet by mouth daily.        Marland Kitchen LIPITOR 20 MG tablet TAKE (1) TABLET BY MOUTH AT BEDTIME.  90 each  3  . Multiple Vitamins-Minerals (MULTIVITAMIN WITH MINERALS) tablet Take 1 tablet by mouth daily.        Marland Kitchen omeprazole (PRILOSEC) 20 MG capsule Take 20 mg by mouth daily.        . potassium chloride SA (K-DUR,KLOR-CON) 20 MEQ tablet Take 1 tablet (20 mEq total) by mouth daily.  90 tablet  1  . valsartan-hydrochlorothiazide (DIOVAN HCT) 320-25 MG per tablet Take 1 tablet by mouth daily.  30 tablet  5  . [DISCONTINUED] torsemide (DEMADEX) 20 MG tablet Take 20 mg by mouth daily.         No current facility-administered medications for this visit.     No past surgical history on file.   Allergies  Allergen Reactions  . Morphine       No family history on file.   Social History Colleen Roberts reports that she has never smoked. She has never used smokeless tobacco. Colleen Roberts reports that she does not drink  alcohol.   Review of Systems CONSTITUTIONAL: No weight loss, fever, chills, weakness or fatigue.  HEENT: Eyes: No visual loss, blurred vision, double vision or yellow sclerae.No hearing loss, sneezing, congestion, runny nose or sore throat.  SKIN: No rash or itching.  CARDIOVASCULAR:  RESPIRATORY: No shortness of breath, cough or sputum.  GASTROINTESTINAL: No anorexia, nausea, vomiting or diarrhea. No abdominal pain or blood.  GENITOURINARY: No burning on urination, no polyuria NEUROLOGICAL: No headache, dizziness, syncope, paralysis, ataxia, numbness or tingling in the extremities. No change in bowel or bladder control.  MUSCULOSKELETAL: No muscle, back pain, joint pain or stiffness.  LYMPHATICS: No enlarged nodes. No history of splenectomy.  PSYCHIATRIC: No history of depression or anxiety.  ENDOCRINOLOGIC: No reports of sweating, cold or heat intolerance. No polyuria or polydipsia.  Marland Kitchen   Physical Examination There were no vitals filed for this visit. There were no vitals filed for this visit.  Gen: resting comfortably, no acute distress HEENT: no scleral icterus, pupils equal round and reactive, no  palptable cervical adenopathy,  CV Resp: Clear to auscultation bilaterally GI: abdomen is soft, non-tender, non-distended, normal bowel sounds, no hepatosplenomegaly MSK: extremities are warm, no edema.  Skin: warm, no rash Neuro:  no focal deficits Psych: appropriate affect   Diagnostic Studies     Assessment and Plan        Antoine Poche, M.D., F.A.C.C.

## 2013-01-09 ENCOUNTER — Ambulatory Visit: Payer: Medicare Other | Admitting: Cardiology

## 2013-01-12 ENCOUNTER — Encounter: Payer: Self-pay | Admitting: Cardiology

## 2013-01-12 ENCOUNTER — Ambulatory Visit (INDEPENDENT_AMBULATORY_CARE_PROVIDER_SITE_OTHER): Payer: Medicare Other | Admitting: Cardiology

## 2013-01-12 VITALS — BP 167/78 | HR 94 | Ht <= 58 in | Wt 117.0 lb

## 2013-01-12 DIAGNOSIS — I447 Left bundle-branch block, unspecified: Secondary | ICD-10-CM

## 2013-01-12 DIAGNOSIS — I2510 Atherosclerotic heart disease of native coronary artery without angina pectoris: Secondary | ICD-10-CM

## 2013-01-12 NOTE — Patient Instructions (Addendum)
Your physician recommends that you schedule a follow-up appointment in: ONE YEAR  Your physician has requested that you regularly monitor and record your blood pressure readings at home. Please use the same machine at the same time of day to check your readings and record them to bring to your follow-up visit.

## 2013-01-12 NOTE — Progress Notes (Signed)
Clinical Summary Ms. Esker is a 79 y.o.female former patient of Dr Daleen Squibb, seen today for follow up of the following medical problems  1. CAD - CABG 05/1999 4 vessel: LIMA to LAD, SVG to diag, SVG to OM, SVG to RCA.  - no recent chest pain. SOB with walking just a few feet, stable for several years. No orthopnea, no PND,occas LE edema - compliant with meds: ASA, valsartan, lipitor  2. HTN - does not check regularly - compliant with meds  3. Carotid stenosis - denies any symptoms  4. PAD - prior PTA and stent placement of the right SFA 11/2001 - night time cramps only in legs, not with exertion.   5. Hyperlipidemia - compliant with statin  Past Medical History  Diagnosis Date  . Cancer   Prior DVT in setting of compression fracture   Allergies  Allergen Reactions  . Morphine      Current Outpatient Prescriptions  Medication Sig Dispense Refill  . amLODipine (NORVASC) 10 MG tablet Take 1 tablet (10 mg total) by mouth daily.  30 tablet  3  . aspirin 81 MG chewable tablet Chew 81 mg by mouth daily.        . bumetanide (BUMEX) 0.5 MG tablet Take 0.5 mg by mouth daily.      . Cholecalciferol (VITAMIN D) 400 UNITS capsule Take 400 Units by mouth daily.        . Coenzyme Q10 (CO Q 10) 100 MG CAPS Take 1 capsule by mouth daily.        . Cyanocobalamin (VITAMIN B 12 PO) Take 1 tablet by mouth daily.        . Homeopathic Products ([employer] LEG CRAMPS PAIN RELIEF) TABS Take 1 tablet by mouth daily.        Marland Kitchen LIPITOR 20 MG tablet TAKE (1) TABLET BY MOUTH AT BEDTIME.  90 each  3  . Multiple Vitamins-Minerals (MULTIVITAMIN WITH MINERALS) tablet Take 1 tablet by mouth daily.        Marland Kitchen omeprazole (PRILOSEC) 20 MG capsule Take 20 mg by mouth daily.        . potassium chloride SA (K-DUR,KLOR-CON) 20 MEQ tablet Take 1 tablet (20 mEq total) by mouth daily.  90 tablet  1  . valsartan-hydrochlorothiazide (DIOVAN HCT) 320-25 MG per tablet Take 1 tablet by mouth daily.  30 tablet  5  .  [DISCONTINUED] torsemide (DEMADEX) 20 MG tablet Take 20 mg by mouth daily.         No current facility-administered medications for this visit.     No past surgical history on file.   Allergies  Allergen Reactions  . Morphine       No family history on file.   Social History Ms. Pardo reports that she has never smoked. She has never used smokeless tobacco. Ms. Godown reports that she does not drink alcohol.   Review of Systems CONSTITUTIONAL: No weight loss, fever, chills, weakness or fatigue.  HEENT: Eyes: No visual loss, blurred vision, double vision or yellow sclerae.No hearing loss, sneezing, congestion, runny nose or sore throat.  SKIN: No rash or itching.  CARDIOVASCULAR: per HPI RESPIRATORY: per HPI GASTROINTESTINAL: No anorexia, nausea, vomiting or diarrhea. No abdominal pain or blood.  GENITOURINARY: No burning on urination, no polyuria NEUROLOGICAL: No headache, dizziness, syncope, paralysis, ataxia, numbness or tingling in the extremities. No change in bowel or bladder control.  MUSCULOSKELETAL: No muscle, back pain, joint pain or stiffness.  LYMPHATICS: No enlarged nodes.  No history of splenectomy.  PSYCHIATRIC: No history of depression or anxiety.  ENDOCRINOLOGIC: No reports of sweating, cold or heat intolerance. No polyuria or polydipsia.  Marland Kitchen   Physical Examination p 94 bp 160/80 Wt 117 lbs BMI 24 Gen: resting comfortably, no acute distress HEENT: no scleral icterus, pupils equal round and reactive, no palptable cervical adenopathy,  CV: RRR, no m/r/g, no JVD, bilateral carotid bruits Resp: Clear to auscultation bilaterally GI: abdomen is soft, non-tender, non-distended, normal bowel sounds, no hepatosplenomegaly MSK: extremities are warm, no edema.  Skin: warm, no rash Neuro:  no focal deficits Psych: appropriate affect   Diagnostic Studies Cath 11/2002 FINDINGS:  1. LV 173/8/20. EF 60% without regional wall motion abnormality.  2. Left main: 90%  ostial stenosis.  3. LAD: Moderate sized vessel giving rise to a single large diagonal  Corwyn Vora. The proximal LAD has diffuse 80% stenosis crossing the takeoff  of the first diagonal Wilhemenia Camba. The LIMA to the mid LAD is widely patent  with excellent distal runoff. The saphenous vein graft to the diagonal  Sherin Murdoch is widely patent. There is an 80% stenosis in a diagonal graft  just downstream of the touchdown of this vein graft. The vessel is  approximately 1 mm in diameter distal to the segment of stenosis.  4. Circumflex: Relatively large vessel giving rise to three obtuse marginal  branches. The vessels are relatively small, suggesting diffuse disease.  However, there are no distinct stenoses. The saphenous vein graft to the  first obtuse marginal is widely patent with excellent distal runoff.  5. RCA: Severely and diffusely diseased throughout its proximal and mid  course with 80-90% stenosis. The saphenous vein graft to the distal RCA  is widely patent with excellent distal runoff.  6. Widely patent left subclavian artery.  08/2010 Echo : grade I diastolic dysfunction, no LVEF mentioned  07/2009 Carotid US:  1. Stable categorization of carotid stenoses with estimated greater than 70% right ICA stenosis and 50 - 69% left ICA stenosis. 2. Blunted left vertebral artery wave form suggesting progressive atherosclerotic occlusive disease.  01/12/13 Clinic EKG: sinus rhythm, LBBB  Assessment and Plan  1. CAD - no current symptoms - continue current medications  2. HTN - above goal in clinic today - the patient's daughter is a Engineer, civil (consulting), I asked them to keep a blood pressure log to present either to me or her PCP at next follow up - continue current medications for now  3. Hyperlipidemia - no recent panel in our system. Patient reports due to be checked with her primary soon, follow up results.   4. Carotid stenosis - she would be high risk for any intervention, will not perform any more  surveillance ultrasounds. She remains asymptomatic.       Antoine Poche, M.D., F.A.C.C.

## 2013-01-23 ENCOUNTER — Other Ambulatory Visit: Payer: Self-pay | Admitting: Cardiology

## 2013-03-20 ENCOUNTER — Other Ambulatory Visit (HOSPITAL_COMMUNITY): Payer: Self-pay | Admitting: Pulmonary Disease

## 2013-03-20 ENCOUNTER — Ambulatory Visit (HOSPITAL_COMMUNITY)
Admission: RE | Admit: 2013-03-20 | Discharge: 2013-03-20 | Disposition: A | Discharge status: Home / Self Care | Payer: Medicare Other | Source: Ambulatory Visit | Attending: Pulmonary Disease | Admitting: Pulmonary Disease

## 2013-03-20 DIAGNOSIS — R079 Chest pain, unspecified: Secondary | ICD-10-CM

## 2013-03-20 DIAGNOSIS — R0789 Other chest pain: Secondary | ICD-10-CM | POA: Insufficient documentation

## 2013-03-22 ENCOUNTER — Other Ambulatory Visit (HOSPITAL_COMMUNITY): Payer: Self-pay | Admitting: Pulmonary Disease

## 2013-03-22 DIAGNOSIS — R7989 Other specified abnormal findings of blood chemistry: Secondary | ICD-10-CM

## 2013-03-22 DIAGNOSIS — R079 Chest pain, unspecified: Secondary | ICD-10-CM

## 2013-03-23 ENCOUNTER — Ambulatory Visit (HOSPITAL_COMMUNITY)
Admission: RE | Admit: 2013-03-23 | Discharge: 2013-03-23 | Disposition: A | Discharge status: Home / Self Care | Payer: Medicare Other | Source: Ambulatory Visit | Attending: Pulmonary Disease | Admitting: Pulmonary Disease

## 2013-03-23 DIAGNOSIS — R7989 Other specified abnormal findings of blood chemistry: Secondary | ICD-10-CM

## 2013-03-23 DIAGNOSIS — R918 Other nonspecific abnormal finding of lung field: Secondary | ICD-10-CM | POA: Insufficient documentation

## 2013-03-23 DIAGNOSIS — R079 Chest pain, unspecified: Secondary | ICD-10-CM | POA: Insufficient documentation

## 2013-03-23 MED ORDER — IOHEXOL 350 MG/ML SOLN
100.0000 mL | Freq: Once | INTRAVENOUS | Status: AC | PRN
  Administered 2013-03-23: 100 mL via INTRAVENOUS

## 2013-03-31 ENCOUNTER — Other Ambulatory Visit: Payer: Self-pay

## 2013-03-31 ENCOUNTER — Other Ambulatory Visit (HOSPITAL_COMMUNITY): Payer: Self-pay | Admitting: Pulmonary Disease

## 2013-03-31 DIAGNOSIS — R0781 Pleurodynia: Secondary | ICD-10-CM

## 2013-03-31 DIAGNOSIS — M549 Dorsalgia, unspecified: Secondary | ICD-10-CM

## 2013-03-31 MED ORDER — VALSARTAN-HYDROCHLOROTHIAZIDE 320-25 MG PO TABS: 1.0000 | ORAL_TABLET | Freq: Every day | ORAL | Status: DC

## 2013-04-03 ENCOUNTER — Encounter (HOSPITAL_COMMUNITY): Payer: Self-pay

## 2013-04-03 ENCOUNTER — Encounter (HOSPITAL_COMMUNITY)
Admission: RE | Admit: 2013-04-03 | Discharge: 2013-04-03 | Disposition: A | Discharge status: Home / Self Care | Payer: Medicare Other | Source: Ambulatory Visit | Attending: Pulmonary Disease | Admitting: Pulmonary Disease

## 2013-04-03 DIAGNOSIS — R0781 Pleurodynia: Secondary | ICD-10-CM

## 2013-04-03 DIAGNOSIS — W19XXXA Unspecified fall, initial encounter: Secondary | ICD-10-CM | POA: Insufficient documentation

## 2013-04-03 DIAGNOSIS — M549 Dorsalgia, unspecified: Secondary | ICD-10-CM

## 2013-04-03 DIAGNOSIS — R079 Chest pain, unspecified: Secondary | ICD-10-CM | POA: Insufficient documentation

## 2013-04-03 DIAGNOSIS — S298XXA Other specified injuries of thorax, initial encounter: Secondary | ICD-10-CM | POA: Insufficient documentation

## 2013-04-03 HISTORY — DX: Essential (primary) hypertension: I10

## 2013-04-03 MED ORDER — TECHNETIUM TC 99M MEDRONATE IV KIT
25.0000 | PACK | Freq: Once | INTRAVENOUS | Status: AC | PRN
  Administered 2013-04-03: 23.8 via INTRAVENOUS

## 2013-04-11 ENCOUNTER — Other Ambulatory Visit (HOSPITAL_COMMUNITY): Payer: Self-pay | Admitting: Pulmonary Disease

## 2013-04-11 DIAGNOSIS — R109 Unspecified abdominal pain: Secondary | ICD-10-CM

## 2013-04-12 ENCOUNTER — Encounter (HOSPITAL_COMMUNITY): Payer: Self-pay

## 2013-04-12 ENCOUNTER — Ambulatory Visit (HOSPITAL_COMMUNITY)
Admission: RE | Admit: 2013-04-12 | Discharge: 2013-04-12 | Disposition: A | Discharge status: Home / Self Care | Payer: Medicare Other | Source: Ambulatory Visit | Attending: Pulmonary Disease | Admitting: Pulmonary Disease

## 2013-04-12 DIAGNOSIS — I10 Essential (primary) hypertension: Secondary | ICD-10-CM | POA: Insufficient documentation

## 2013-04-12 DIAGNOSIS — N83209 Unspecified ovarian cyst, unspecified side: Secondary | ICD-10-CM | POA: Insufficient documentation

## 2013-04-12 DIAGNOSIS — R109 Unspecified abdominal pain: Secondary | ICD-10-CM | POA: Insufficient documentation

## 2013-04-12 DIAGNOSIS — C4490 Unspecified malignant neoplasm of skin, unspecified: Secondary | ICD-10-CM | POA: Insufficient documentation

## 2013-04-12 MED ORDER — IOHEXOL 300 MG/ML  SOLN
100.0000 mL | Freq: Once | INTRAMUSCULAR | Status: AC | PRN
  Administered 2013-04-12: 100 mL via INTRAVENOUS

## 2013-08-04 NOTE — Progress Notes (Signed)
This encounter was created in error - please disregard.

## 2013-09-26 ENCOUNTER — Telehealth: Payer: Self-pay | Admitting: Cardiology

## 2013-09-26 MED ORDER — VALSARTAN-HYDROCHLOROTHIAZIDE 320-25 MG PO TABS: 1.0000 | ORAL_TABLET | Freq: Every day | ORAL | Status: DC

## 2013-09-26 NOTE — Telephone Encounter (Signed)
Refill request complete 

## 2013-09-26 NOTE — Telephone Encounter (Signed)
Received fax refill request  Rx # [id number] Medication:  Valsartan / HCTZ 320 / 25 mg tab Qty 30 Sig:  Take one tablet by mouth once a day Physician:  Harl Bowie

## 2013-10-24 ENCOUNTER — Telehealth: Payer: Self-pay | Admitting: *Deleted

## 2013-10-24 MED ORDER — ATORVASTATIN CALCIUM 20 MG PO TABS: 20.0000 mg | ORAL_TABLET | Freq: Every day | ORAL | Status: DC

## 2013-10-24 NOTE — Telephone Encounter (Signed)
rx refill for atorvastain 20 mg #90 to [facility]

## 2013-10-24 NOTE — Telephone Encounter (Signed)
Refill complete 

## 2013-12-28 ENCOUNTER — Other Ambulatory Visit: Payer: Self-pay | Admitting: Cardiology

## 2014-01-16 ENCOUNTER — Encounter: Payer: Self-pay | Admitting: Cardiology

## 2014-01-16 ENCOUNTER — Ambulatory Visit (INDEPENDENT_AMBULATORY_CARE_PROVIDER_SITE_OTHER): Payer: Medicare Other | Admitting: Cardiology

## 2014-01-16 ENCOUNTER — Ambulatory Visit (INDEPENDENT_AMBULATORY_CARE_PROVIDER_SITE_OTHER): Payer: Medicare Other | Admitting: *Deleted

## 2014-01-16 VITALS — BP 142/58 | HR 69 | Ht <= 58 in | Wt 110.0 lb

## 2014-01-16 DIAGNOSIS — Z23 Encounter for immunization: Secondary | ICD-10-CM

## 2014-01-16 DIAGNOSIS — I2510 Atherosclerotic heart disease of native coronary artery without angina pectoris: Secondary | ICD-10-CM

## 2014-01-16 DIAGNOSIS — I10 Essential (primary) hypertension: Secondary | ICD-10-CM

## 2014-01-16 DIAGNOSIS — E0590 Thyrotoxicosis, unspecified without thyrotoxic crisis or storm: Secondary | ICD-10-CM

## 2014-01-16 DIAGNOSIS — R0602 Shortness of breath: Secondary | ICD-10-CM

## 2014-01-16 DIAGNOSIS — E785 Hyperlipidemia, unspecified: Secondary | ICD-10-CM

## 2014-01-16 MED ORDER — POTASSIUM CHLORIDE CRYS ER 20 MEQ PO TBCR: 20.0000 meq | EXTENDED_RELEASE_TABLET | Freq: Every day | ORAL | Status: DC | PRN

## 2014-01-16 MED ORDER — BUMETANIDE 0.5 MG PO TABS: 0.5000 mg | ORAL_TABLET | Freq: Every day | ORAL | Status: DC | PRN

## 2014-01-16 NOTE — Patient Instructions (Signed)
Your physician recommends that you schedule a follow-up appointment in: 3 months with Dr. Harl Bowie or Jory Sims  Your physician recommends that you continue on your current medications as directed. Please refer to the Current Medication list given to you today.  Your physician has requested that you have an echocardiogram. Echocardiography is a painless test that uses sound waves to create images of your heart. It provides your doctor with information about the size and shape of your heart and how well your heart's chambers and valves are working. This procedure takes approximately one hour. There are no restrictions for this procedure.  Your physician recommends that you return for lab work CMP/TSH/LIPIDS/CBC/MAG  Your physician has requested that you regularly monitor and record your blood pressure readings at home. Please use the same machine at the same time of day to check your readings and record them to bring to your follow-up visit. PLEASE BRING BACK WHEN YOU COME FOR YOUR ECHOCARDIOGRAM  Thank you for choosing [facility]!!

## 2014-01-16 NOTE — Progress Notes (Signed)
Clinical Summary Colleen Roberts is a 80 y.o.female seen today for follow up of the following medical problems.  1. CAD - CABG 05/1999 4 vessel: LIMA to LAD, SVG to diag, SVG to OM, SVG to RCA.  - no recent chest pain. SOB with walking just a few feet, which has gotten somewhat worst since last visit. Can have some LE edema, seems to improve with her prn bumex - compliant with meds: ASA, valsartan, lipitor - increased DOE  2. HTN - checks at times at home, typically 160s/70s. Typically checks prior to taking medicines.  - compliant with meds  3. Carotid stenosis - denies any symptoms  4. PAD - prior PTA and stent placement of the right SFA 11/2001 - night time cramps only in legs, no claudication pain.   5. Hyperlipidemia - compliant with statin   Past Medical History  Diagnosis Date  . Cancer     skin  . Hypertension      Allergies  Allergen Reactions  . Morphine Other (See Comments)    Nearly died per patient     Current Outpatient Prescriptions  Medication Sig Dispense Refill  . amLODipine (NORVASC) 10 MG tablet TAKE 1 TABLET BY MOUTH ONCE A DAY. 30 tablet 6  . aspirin 81 MG chewable tablet Chew 81 mg by mouth daily.      Marland Kitchen atorvastatin (LIPITOR) 20 MG tablet Take 1 tablet (20 mg total) by mouth daily at 6 PM. 90 tablet 2  . bumetanide (BUMEX) 0.5 MG tablet Take 0.5 mg by mouth daily as needed.     . Cholecalciferol (VITAMIN D) 400 UNITS capsule Take 400 Units by mouth daily.      . Coenzyme Q10 (CO Q 10) 100 MG CAPS Take 1 capsule by mouth daily.      . Cyanocobalamin (VITAMIN B 12 PO) Take 1 tablet by mouth daily.      . Homeopathic Products ([employer] LEG CRAMPS PAIN RELIEF) TABS Take 2 tablets by mouth 4 (four) times daily.     . Magnesium 250 MG TABS Take 1 tablet by mouth daily.    . Multiple Vitamins-Minerals (MULTIVITAMIN WITH MINERALS) tablet Take 1 tablet by mouth daily.      Marland Kitchen omeprazole (PRILOSEC) 20 MG capsule Take 20 mg by mouth daily.      . potassium  chloride SA (K-DUR,KLOR-CON) 20 MEQ tablet Take 20 mEq by mouth daily as needed.    . valsartan-hydrochlorothiazide (DIOVAN HCT) 320-25 MG per tablet Take 1 tablet by mouth daily. 90 tablet 3  . [DISCONTINUED] torsemide (DEMADEX) 20 MG tablet Take 20 mg by mouth daily.       No current facility-administered medications for this visit.     No past surgical history on file.   Allergies  Allergen Reactions  . Morphine Other (See Comments)    Nearly died per patient      No family history on file.   Social History Ms. Hogrefe reports that she has never smoked. She has never used smokeless tobacco. Ms. Cannell reports that she does not drink alcohol.   Review of Systems CONSTITUTIONAL: No weight loss, fever, chills, weakness or fatigue.  HEENT: Eyes: No visual loss, blurred vision, double vision or yellow sclerae.No hearing loss, sneezing, congestion, runny nose or sore throat.  SKIN: No rash or itching.  CARDIOVASCULAR:per HPI  RESPIRATORY: No shortness of breath, cough or sputum.  GASTROINTESTINAL: No anorexia, nausea, vomiting or diarrhea. No abdominal pain or blood.  GENITOURINARY:  No burning on urination, no polyuria NEUROLOGICAL: No headache, dizziness, syncope, paralysis, ataxia, numbness or tingling in the extremities. No change in bowel or bladder control.  MUSCULOSKELETAL: No muscle, back pain, joint pain or stiffness.  LYMPHATICS: No enlarged nodes. No history of splenectomy.  PSYCHIATRIC: No history of depression or anxiety.  ENDOCRINOLOGIC: No reports of sweating, cold or heat intolerance. No polyuria or polydipsia.  Marland Kitchen   Physical Examination p 69 bp 142/58 Wt 110 lbs BMI 23 Gen: resting comfortably, no acute distress HEENT: no scleral icterus, pupils equal round and reactive, no palptable cervical adenopathy,  CV: RRR, no m/r/g, no JVD, left carotid bruit Resp: Clear to auscultation bilaterally GI: abdomen is soft, non-tender, non-distended, normal bowel sounds,  no hepatosplenomegaly MSK: extremities are warm, no edema.  Skin: warm, no rash Neuro:  no focal deficits Psych: appropriate affect   Diagnostic Studies Cath 11/2002 FINDINGS:  1. LV 173/8/20. EF 60% without regional wall motion abnormality.  2. Left main: 90% ostial stenosis.  3. LAD: Moderate sized vessel giving rise to a single large diagonal  Carolann Brazell. The proximal LAD has diffuse 80% stenosis crossing the takeoff  of the first diagonal Aleric Froelich. The LIMA to the mid LAD is widely patent  with excellent distal runoff. The saphenous vein graft to the diagonal  Aquarius Latouche is widely patent. There is an 80% stenosis in a diagonal graft  just downstream of the touchdown of this vein graft. The vessel is  approximately 1 mm in diameter distal to the segment of stenosis.  4. Circumflex: Relatively large vessel giving rise to three obtuse marginal  branches. The vessels are relatively small, suggesting diffuse disease.  However, there are no distinct stenoses. The saphenous vein graft to the  first obtuse marginal is widely patent with excellent distal runoff.  5. RCA: Severely and diffusely diseased throughout its proximal and mid  course with 80-90% stenosis. The saphenous vein graft to the distal RCA  is widely patent with excellent distal runoff.  6. Widely patent left subclavian artery.  08/2010 Echo : grade I diastolic dysfunction, no LVEF mentioned  07/2009 Carotid US:  1. Stable categorization of carotid stenoses with estimated greater than 70% right ICA stenosis and 50 - 69% left ICA stenosis. 2. Blunted left vertebral artery wave form suggesting progressive atherosclerotic occlusive disease.  01/12/13 Clinic EKG: sinus rhythm, LBBB    Assessment and Plan  1. CAD - no current chest pain, reports some increased DOE and LE edema. Will repeat echo  2. HTN - at goal in clinic, home bp's elevated, though they are checking her bp first thing in AM prior to taking  meds - asked to check bp at least one hour after taking AM meds and bring in bp log  3. Hyperlipidemia - repeat lipid panel, continue current statin for now  4. Carotid stenosis - she would be high risk for any intervention, will not perform any more surveillance ultrasounds. She remains asymptomatic.    F/u 3 months   Arnoldo Lenis, M.D.

## 2014-04-18 ENCOUNTER — Ambulatory Visit (HOSPITAL_COMMUNITY)
Admission: RE | Admit: 2014-04-18 | Discharge: 2014-04-18 | Disposition: A | Discharge status: Home / Self Care | Payer: Medicare Other | Source: Ambulatory Visit | Attending: Cardiology | Admitting: Cardiology

## 2014-04-18 DIAGNOSIS — I10 Essential (primary) hypertension: Secondary | ICD-10-CM | POA: Diagnosis not present

## 2014-04-18 DIAGNOSIS — R0602 Shortness of breath: Secondary | ICD-10-CM | POA: Insufficient documentation

## 2014-04-18 DIAGNOSIS — E785 Hyperlipidemia, unspecified: Secondary | ICD-10-CM | POA: Diagnosis not present

## 2014-04-18 NOTE — Progress Notes (Signed)
*  PRELIMINARY RESULTS* Echocardiogram 2D Echocardiogram has been performed.  Colleen Roberts 04/18/2014, 12:30 PM

## 2014-04-19 ENCOUNTER — Telehealth: Payer: Self-pay | Admitting: *Deleted

## 2014-04-19 NOTE — Telephone Encounter (Signed)
-----   Message from Arnoldo Lenis, MD sent at 04/19/2014 11:19 AM EST ----- Echo looks good. Overall heart function is good  Zandra Abts MD

## 2014-04-19 NOTE — Telephone Encounter (Signed)
Pt made aware, forwarded to Dr. Luan Pulling

## 2014-05-09 LAB — COMPLETE METABOLIC PANEL WITH GFR
ALK PHOS: 72 U/L (ref 39–117)
ALT: 13 U/L (ref 0–35)
AST: 15 U/L (ref 0–37)
Albumin: 3.8 g/dL (ref 3.5–5.2)
BILIRUBIN TOTAL: 0.5 mg/dL (ref 0.2–1.2)
BUN: 19 mg/dL (ref 6–23)
CO2: 31 meq/L (ref 19–32)
Calcium: 8.9 mg/dL (ref 8.4–10.5)
Chloride: 95 mEq/L — ABNORMAL LOW (ref 96–112)
Creat: 0.75 mg/dL (ref 0.50–1.10)
GFR, Est African American: 79 mL/min
GFR, Est Non African American: 69 mL/min
GLUCOSE: 148 mg/dL — AB (ref 70–99)
Potassium: 4.2 mEq/L (ref 3.5–5.3)
Sodium: 133 mEq/L — ABNORMAL LOW (ref 135–145)
Total Protein: 6.9 g/dL (ref 6.0–8.3)

## 2014-05-09 LAB — CBC WITH DIFFERENTIAL/PLATELET
BASOS ABS: 0 10*3/uL (ref 0.0–0.1)
BASOS PCT: 0 % (ref 0–1)
Eosinophils Absolute: 0.1 10*3/uL (ref 0.0–0.7)
Eosinophils Relative: 1 % (ref 0–5)
HCT: 38.9 % (ref 36.0–46.0)
Hemoglobin: 13.1 g/dL (ref 12.0–15.0)
Lymphocytes Relative: 22 % (ref 12–46)
Lymphs Abs: 1.8 10*3/uL (ref 0.7–4.0)
MCH: 29.6 pg (ref 26.0–34.0)
MCHC: 33.7 g/dL (ref 30.0–36.0)
MCV: 87.8 fL (ref 78.0–100.0)
Monocytes Absolute: 0.7 10*3/uL (ref 0.1–1.0)
Monocytes Relative: 9 % (ref 3–12)
NEUTROS ABS: 5.6 10*3/uL (ref 1.7–7.7)
NEUTROS PCT: 68 % (ref 43–77)
Platelets: 228 10*3/uL (ref 150–400)
RBC: 4.43 MIL/uL (ref 3.87–5.11)
RDW: 13.2 % (ref 11.5–15.5)
WBC: 8.3 10*3/uL (ref 4.0–10.5)

## 2014-05-09 LAB — LIPID PANEL
CHOLESTEROL: 151 mg/dL (ref 0–200)
HDL: 63 mg/dL (ref >=46)
LDL Cholesterol: 69 mg/dL (ref 0–99)
TRIGLYCERIDES: 95 mg/dL (ref <150)
Total CHOL/HDL Ratio: 2.4 Ratio
VLDL: 19 mg/dL (ref 0–40)

## 2014-05-09 LAB — MAGNESIUM: Magnesium: 2 mg/dL (ref 1.5–2.5)

## 2014-05-09 LAB — TSH: TSH: 2.014 u[IU]/mL (ref 0.350–4.500)

## 2014-05-10 ENCOUNTER — Telehealth: Payer: Self-pay | Admitting: *Deleted

## 2014-05-10 NOTE — Telephone Encounter (Signed)
Pt made aware, forwarded to Dr. Luan Pulling

## 2014-05-10 NOTE — Telephone Encounter (Signed)
-----   Message from Arnoldo Lenis, MD sent at 05/10/2014  1:20 PM EST ----- Labs look good

## 2014-05-14 ENCOUNTER — Ambulatory Visit (INDEPENDENT_AMBULATORY_CARE_PROVIDER_SITE_OTHER): Payer: Medicare Other | Admitting: Cardiology

## 2014-05-14 ENCOUNTER — Encounter: Payer: Self-pay | Admitting: Cardiology

## 2014-05-14 VITALS — BP 138/62 | HR 53 | Ht <= 58 in | Wt 109.2 lb

## 2014-05-14 DIAGNOSIS — I6523 Occlusion and stenosis of bilateral carotid arteries: Secondary | ICD-10-CM

## 2014-05-14 DIAGNOSIS — I2510 Atherosclerotic heart disease of native coronary artery without angina pectoris: Secondary | ICD-10-CM

## 2014-05-14 DIAGNOSIS — I10 Essential (primary) hypertension: Secondary | ICD-10-CM | POA: Diagnosis not present

## 2014-05-14 DIAGNOSIS — E785 Hyperlipidemia, unspecified: Secondary | ICD-10-CM

## 2014-05-14 NOTE — Patient Instructions (Signed)
Your physician wants you to follow-up in: 6 months with Dr.Branch You will receive a reminder letter in the mail two months in advance. If you don't receive a letter, please call our office to schedule the follow-up appointment.   Your physician recommends that you continue on your current medications as directed. Please refer to the Current Medication list given to you today.    Thank you for choosing [facility] !        

## 2014-05-14 NOTE — Progress Notes (Signed)
Clinical Summary Colleen Roberts is a 81 y.o.female seen today for follow up of the following medical problems.   1. CAD - CABG 05/1999 4 vessel: LIMA to LAD, SVG to diag, SVG to OM, SVG to RCA. - 04/2014 echo: LVEF 43-15%, grade I diastolic dysfunction   - notes some occasional SOB, unchanged in severity.  - denies any chest pain.  2. HTN - checks at times at home, typically 150s/60s.  - compliant with meds  3. Carotid stenosis - denies any symptoms  4. PAD - prior PTA and stent placement of the right SFA 11/2001 - night time cramps only in legs, no symptoms with walking. No foot sores.   5. Hyperlipidemia - compliant with statin 05/2014 TC 151 TG 95 HDL 63 LDL 69 Past Medical History  Diagnosis Date  . Cancer     skin  . Hypertension      Allergies  Allergen Reactions  . Morphine Other (See Comments)    Nearly died per patient     Current Outpatient Prescriptions  Medication Sig Dispense Refill  . amLODipine (NORVASC) 10 MG tablet TAKE 1 TABLET BY MOUTH ONCE A DAY. 30 tablet 6  . aspirin 81 MG chewable tablet Chew 81 mg by mouth daily.      Marland Kitchen atorvastatin (LIPITOR) 20 MG tablet Take 1 tablet (20 mg total) by mouth daily at 6 PM. 90 tablet 2  . bumetanide (BUMEX) 0.5 MG tablet Take 1 tablet (0.5 mg total) by mouth daily as needed. 90 tablet 3  . Cholecalciferol (VITAMIN D) 400 UNITS capsule Take 400 Units by mouth daily.      . Coenzyme Q10 (CO Q 10) 100 MG CAPS Take 1 capsule by mouth daily.      . Cyanocobalamin (VITAMIN B 12 PO) Take 1 tablet by mouth daily.      . Homeopathic Products ([employer] LEG CRAMPS PAIN RELIEF) TABS Take 2 tablets by mouth 4 (four) times daily.     . Magnesium 250 MG TABS Take 1 tablet by mouth daily.    . Multiple Vitamins-Minerals (MULTIVITAMIN WITH MINERALS) tablet Take 1 tablet by mouth daily.      Marland Kitchen omeprazole (PRILOSEC) 20 MG capsule Take 20 mg by mouth daily.      . potassium chloride SA (K-DUR,KLOR-CON) 20 MEQ tablet Take 1 tablet (20  mEq total) by mouth daily as needed. 90 tablet 3  . valsartan-hydrochlorothiazide (DIOVAN HCT) 320-25 MG per tablet Take 1 tablet by mouth daily. 90 tablet 3  . [DISCONTINUED] torsemide (DEMADEX) 20 MG tablet Take 20 mg by mouth daily.       No current facility-administered medications for this visit.     No past surgical history on file.   Allergies  Allergen Reactions  . Morphine Other (See Comments)    Nearly died per patient      No family history on file.   Social History Colleen Roberts reports that she has never smoked. She has never used smokeless tobacco. Colleen Roberts reports that she does not drink alcohol.   Review of Systems CONSTITUTIONAL: No weight loss, fever, chills, weakness or fatigue.  HEENT: Eyes: No visual loss, blurred vision, double vision or yellow sclerae.No hearing loss, sneezing, congestion, runny nose or sore throat.  SKIN: No rash or itching.  CARDIOVASCULAR: per HPI RESPIRATORY: No shortness of breath, cough or sputum.  GASTROINTESTINAL: No anorexia, nausea, vomiting or diarrhea. No abdominal pain or blood.  GENITOURINARY: No burning on urination, no  polyuria NEUROLOGICAL: No headache, dizziness, syncope, paralysis, ataxia, numbness or tingling in the extremities. No change in bowel or bladder control.  MUSCULOSKELETAL: No muscle, back pain, joint pain or stiffness.  LYMPHATICS: No enlarged nodes. No history of splenectomy.  PSYCHIATRIC: No history of depression or anxiety.  ENDOCRINOLOGIC: No reports of sweating, cold or heat intolerance. No polyuria or polydipsia.  Marland Kitchen   Physical Examination p 53 bp 138/62 Wt 109 lbs BMI 23 Gen: resting comfortably, no acute distress HEENT: no scleral icterus, pupils equal round and reactive, no palptable cervical adenopathy,  CV: RRR, no m/r/g, no JVD, + bilateral carotid bruits Resp: Clear to auscultation bilaterally GI: abdomen is soft, non-tender, non-distended, normal bowel sounds, no hepatosplenomegaly MSK:  extremities are warm, no edema.  Skin: warm, no rash Neuro:  no focal deficits Psych: appropriate affect   Diagnostic Studies Cath 11/2002 FINDINGS:  1. LV 173/8/20. EF 60% without regional wall motion abnormality.  2. Left main: 90% ostial stenosis.  3. LAD: Moderate sized vessel giving rise to a single large diagonal  branch. The proximal LAD has diffuse 80% stenosis crossing the takeoff  of the first diagonal branch. The LIMA to the mid LAD is widely patent  with excellent distal runoff. The saphenous vein graft to the diagonal  branch is widely patent. There is an 80% stenosis in a diagonal graft  just downstream of the touchdown of this vein graft. The vessel is  approximately 1 mm in diameter distal to the segment of stenosis.  4. Circumflex: Relatively large vessel giving rise to three obtuse marginal  branches. The vessels are relatively small, suggesting diffuse disease.  However, there are no distinct stenoses. The saphenous vein graft to the  first obtuse marginal is widely patent with excellent distal runoff.  5. RCA: Severely and diffusely diseased throughout its proximal and mid  course with 80-90% stenosis. The saphenous vein graft to the distal RCA  is widely patent with excellent distal runoff.  6. Widely patent left subclavian artery.  08/2010 Echo : grade I diastolic dysfunction, no LVEF mentioned  07/2009 Carotid US:  1. Stable categorization of carotid stenoses with estimated greater than 70% right ICA stenosis and 50 - 69% left ICA stenosis. 2. Blunted left vertebral artery wave form suggesting progressive atherosclerotic occlusive disease.  04/2014 Echo - Left ventricle: The cavity size was normal. Wall thickness was increased in a pattern of moderate LVH. Systolic function was normal. The estimated ejection fraction was in the range of 60% to 65%. Doppler parameters are consistent with abnormal left ventricular relaxation (grade 1  diastolic dysfunction). - Aortic valve: Mildly calcified annulus. Trileaflet; mildly thickened leaflets. Valve area (VTI): 1.67 cm^2. Valve area (Vmax): 1.64 cm^2. Valve area (Vmean): 1.83 cm^2. - Mitral valve: Mildly calcified annulus. Mildly thickened leaflets . - Systemic veins: IVC is small, suggesting low RA pressures and hypovolemia. - Technically difficult study.  Assessment and Plan  1. CAD - no current chest pain, reports some DOE that is stable. Recent echo shows normal LVEF, grade I diastolic dysfunction - discussed with patient and family symptoms could be due to recurrent obstructive CAD. Given age and limited symptoms, she and her family are not interested in further testing at this time,.   2. HTN - at goal in clinic, home bp's elevated though reasonable numbers given her age. Continue current meds  3. Hyperlipidemia - at goal,continue current meds  4. Carotid stenosis - she would be high risk for any intervention, will not perform  any more surveillance ultrasounds. She remains asymptomatic.    F/u 6 months   Arnoldo Lenis, M.D.

## 2014-05-29 ENCOUNTER — Other Ambulatory Visit: Payer: Self-pay | Admitting: Cardiology

## 2014-07-24 ENCOUNTER — Other Ambulatory Visit: Payer: Self-pay | Admitting: Cardiology

## 2014-09-11 ENCOUNTER — Encounter (HOSPITAL_COMMUNITY): Payer: Self-pay | Admitting: *Deleted

## 2014-09-11 ENCOUNTER — Other Ambulatory Visit: Payer: Self-pay

## 2014-09-11 ENCOUNTER — Emergency Department (HOSPITAL_COMMUNITY)
Admission: EM | Admit: 2014-09-11 | Discharge: 2014-09-11 | Disposition: A | Discharge status: Home / Self Care | Payer: Medicare Other | Attending: Emergency Medicine | Admitting: Emergency Medicine

## 2014-09-11 ENCOUNTER — Encounter (HOSPITAL_COMMUNITY)
Admission: EM | Disposition: A | Discharge status: Home / Self Care | Payer: Self-pay | Source: Home / Self Care | Attending: Emergency Medicine

## 2014-09-11 DIAGNOSIS — Y9289 Other specified places as the place of occurrence of the external cause: Secondary | ICD-10-CM | POA: Insufficient documentation

## 2014-09-11 DIAGNOSIS — R1314 Dysphagia, pharyngoesophageal phase: Secondary | ICD-10-CM

## 2014-09-11 DIAGNOSIS — K225 Diverticulum of esophagus, acquired: Secondary | ICD-10-CM | POA: Diagnosis not present

## 2014-09-11 DIAGNOSIS — Q394 Esophageal web: Secondary | ICD-10-CM

## 2014-09-11 DIAGNOSIS — I10 Essential (primary) hypertension: Secondary | ICD-10-CM | POA: Diagnosis not present

## 2014-09-11 DIAGNOSIS — Z79899 Other long term (current) drug therapy: Secondary | ICD-10-CM | POA: Insufficient documentation

## 2014-09-11 DIAGNOSIS — X58XXXA Exposure to other specified factors, initial encounter: Secondary | ICD-10-CM | POA: Diagnosis not present

## 2014-09-11 DIAGNOSIS — Z85828 Personal history of other malignant neoplasm of skin: Secondary | ICD-10-CM | POA: Diagnosis not present

## 2014-09-11 DIAGNOSIS — T18128A Food in esophagus causing other injury, initial encounter: Secondary | ICD-10-CM | POA: Diagnosis not present

## 2014-09-11 DIAGNOSIS — Z7982 Long term (current) use of aspirin: Secondary | ICD-10-CM | POA: Diagnosis not present

## 2014-09-11 DIAGNOSIS — Y9389 Activity, other specified: Secondary | ICD-10-CM | POA: Insufficient documentation

## 2014-09-11 DIAGNOSIS — Y998 Other external cause status: Secondary | ICD-10-CM | POA: Diagnosis not present

## 2014-09-11 DIAGNOSIS — R1310 Dysphagia, unspecified: Secondary | ICD-10-CM | POA: Diagnosis present

## 2014-09-11 HISTORY — PX: ESOPHAGOGASTRODUODENOSCOPY: SHX5428

## 2014-09-11 LAB — CBC WITH DIFFERENTIAL/PLATELET
Basophils Absolute: 0 10*3/uL (ref 0.0–0.1)
Basophils Relative: 0 % (ref 0–1)
Eosinophils Absolute: 0.1 10*3/uL (ref 0.0–0.7)
Eosinophils Relative: 1 % (ref 0–5)
HCT: 37.4 % (ref 36.0–46.0)
Hemoglobin: 12.7 g/dL (ref 12.0–15.0)
Lymphocytes Relative: 29 % (ref 12–46)
Lymphs Abs: 2.1 10*3/uL (ref 0.7–4.0)
MCH: 29.1 pg (ref 26.0–34.0)
MCHC: 34 g/dL (ref 30.0–36.0)
MCV: 85.6 fL (ref 78.0–100.0)
MONO ABS: 0.7 10*3/uL (ref 0.1–1.0)
MONOS PCT: 10 % (ref 3–12)
NEUTROS ABS: 4.2 10*3/uL (ref 1.7–7.7)
Neutrophils Relative %: 60 % (ref 43–77)
Platelets: 194 10*3/uL (ref 150–400)
RBC: 4.37 MIL/uL (ref 3.87–5.11)
RDW: 13.3 % (ref 11.5–15.5)
WBC: 7 10*3/uL (ref 4.0–10.5)

## 2014-09-11 LAB — COMPREHENSIVE METABOLIC PANEL
ALT: 14 U/L (ref 14–54)
AST: 15 U/L (ref 15–41)
Albumin: 3.6 g/dL (ref 3.5–5.0)
Alkaline Phosphatase: 68 U/L (ref 38–126)
Anion gap: 9 (ref 5–15)
BUN: 22 mg/dL — ABNORMAL HIGH (ref 6–20)
CO2: 28 mmol/L (ref 22–32)
Calcium: 9.1 mg/dL (ref 8.9–10.3)
Chloride: 97 mmol/L — ABNORMAL LOW (ref 101–111)
Creatinine, Ser: 0.78 mg/dL (ref 0.44–1.00)
Glucose, Bld: 140 mg/dL — ABNORMAL HIGH (ref 65–99)
Potassium: 3.2 mmol/L — ABNORMAL LOW (ref 3.5–5.1)
SODIUM: 134 mmol/L — AB (ref 135–145)
TOTAL PROTEIN: 7.1 g/dL (ref 6.5–8.1)
Total Bilirubin: 0.7 mg/dL (ref 0.3–1.2)

## 2014-09-11 SURGERY — EGD (ESOPHAGOGASTRODUODENOSCOPY)
Anesthesia: Moderate Sedation

## 2014-09-11 SURGERY — Surgical Case
Anesthesia: *Unknown

## 2014-09-11 MED ORDER — LIDOCAINE VISCOUS 2 % MT SOLN
OROMUCOSAL | Status: AC
  Filled 2014-09-11: qty 15

## 2014-09-11 MED ORDER — ONDANSETRON HCL 4 MG/2ML IJ SOLN
INTRAMUSCULAR | Status: DC | PRN
  Administered 2014-09-11: 4 mg via INTRAVENOUS

## 2014-09-11 MED ORDER — BUTAMBEN-TETRACAINE-BENZOCAINE 2-2-14 % EX AERO
INHALATION_SPRAY | CUTANEOUS | Status: DC | PRN
  Administered 2014-09-11: 1 via TOPICAL

## 2014-09-11 MED ORDER — SODIUM CHLORIDE 0.9 % IV SOLN
Freq: Once | INTRAVENOUS | Status: AC
  Administered 2014-09-11: 12:00:00 via INTRAVENOUS

## 2014-09-11 MED ORDER — MIDAZOLAM HCL 5 MG/5ML IJ SOLN
INTRAMUSCULAR | Status: AC
  Filled 2014-09-11: qty 10

## 2014-09-11 MED ORDER — SIMETHICONE 40 MG/0.6ML PO SUSP
ORAL | Status: DC | PRN
  Administered 2014-09-11: 14:00:00

## 2014-09-11 MED ORDER — ONDANSETRON HCL 4 MG/2ML IJ SOLN
INTRAMUSCULAR | Status: AC
  Filled 2014-09-11: qty 2

## 2014-09-11 MED ORDER — MEPERIDINE HCL 100 MG/ML IJ SOLN
INTRAMUSCULAR | Status: AC
  Filled 2014-09-11: qty 2

## 2014-09-11 MED ORDER — MIDAZOLAM HCL 5 MG/5ML IJ SOLN
INTRAMUSCULAR | Status: DC | PRN
  Administered 2014-09-11 (×2): 1 mg via INTRAVENOUS
  Administered 2014-09-11: 0.5 mg via INTRAVENOUS

## 2014-09-11 MED ORDER — MEPERIDINE HCL 100 MG/ML IJ SOLN
INTRAMUSCULAR | Status: DC | PRN
  Administered 2014-09-11: 25 mg via INTRAVENOUS

## 2014-09-11 NOTE — ED Notes (Signed)
Pt states she started having difficulties swallowing x1 month, yesterday at lunch pt states she was unable to swallow anything at all, still unable to swallow.

## 2014-09-11 NOTE — H&P (Signed)
@LOGO @   Primary Care Physician:  Alonza Bogus, MD Primary Gastroenterologist:  Dr. Gala Romney  Pre-Procedure History & Physical: HPI:  Colleen Roberts is a 81 y.o. female here for evaluation of a probable food impaction. Was eating a sandwich yesterday and after the third bite felt it "hang". Has not been able to even swallow saliva since that time. Has had vague intermittent esophageal dysphagia for months. Probably some close calls as she reports. Long-standing GERD well controlled on omeprazole 20 mg daily. No prior EGD. Distant colonoscopy many years ago. Nothing else going on acute in the ED after evaluation. I do note that her potassium is somewhat low at 3.2 showing takes 1 potassium 20 mEq tablet weekly.   Past Medical History  Diagnosis Date  . Cancer     skin  . Hypertension     Past Surgical History  Procedure Laterality Date  . Cholecystectomy    . Coronary artery bypass graft    . Eye surgery Bilateral   . Tonsillectomy    . Thyroid surgery      Prior to Admission medications   Medication Sig Start Date End Date Taking? Authorizing Provider  amLODipine (NORVASC) 10 MG tablet TAKE 1 TABLET BY MOUTH ONCE A DAY. 07/24/14  Yes Arnoldo Lenis, MD  aspirin 81 MG chewable tablet Chew 81 mg by mouth daily.     Yes Historical Provider, MD  atorvastatin (LIPITOR) 20 MG tablet TAKE (1) TABLET BY MOUTH AT BEDTIME. 05/30/14  Yes Arnoldo Lenis, MD  bumetanide (BUMEX) 0.5 MG tablet Take 1 tablet (0.5 mg total) by mouth daily as needed. Patient taking differently: Take 0.5 mg by mouth daily as needed (fluid).  01/16/14  Yes Arnoldo Lenis, MD  Cholecalciferol (VITAMIN D) 400 UNITS capsule Take 400 Units by mouth daily.     Yes Historical Provider, MD  Homeopathic Products ([employer]S LEG CRAMPS PAIN RELIEF) TABS Take 2 tablets by mouth 4 (four) times daily.    Yes Historical Provider, MD  Magnesium 250 MG TABS Take 1 tablet by mouth daily.   Yes Historical Provider, MD  Multiple  Vitamins-Minerals (MULTIVITAMIN WITH MINERALS) tablet Take 1 tablet by mouth daily.     Yes Historical Provider, MD  omeprazole (PRILOSEC) 20 MG capsule Take 20 mg by mouth daily.     Yes Historical Provider, MD  potassium chloride SA (K-DUR,KLOR-CON) 20 MEQ tablet Take 1 tablet (20 mEq total) by mouth daily as needed. 01/16/14  Yes Arnoldo Lenis, MD  valsartan-hydrochlorothiazide (DIOVAN HCT) 320-25 MG per tablet Take 1 tablet by mouth daily. 09/26/13  Yes Arnoldo Lenis, MD    Allergies as of 09/11/2014 - Review Complete 09/11/2014  Allergen Reaction Noted  . Morphine Other (See Comments) 07/19/2009    History reviewed. No pertinent family history.  History   Social History  . Marital Status: Widowed    Spouse Name: N/A  . Number of Children: 1  . Years of Education: N/A   Occupational History  . retired    Social History Main Topics  . Smoking status: Never Smoker   . Smokeless tobacco: Never Used  . Alcohol Use: No  . Drug Use: No  . Sexual Activity: Not on file   Other Topics Concern  . Not on file   Social History Narrative    Review of Systems: See HPI, otherwise negative ROS  Physical Exam: BP 119/75 mmHg  Pulse 74  Temp(Src) 97.8 F (36.6 C) (Oral)  Resp 16  Ht 4\' 10"  (1.473 m)  Wt 105 lb (47.628 kg)  BMI 21.95 kg/m2  SpO2 98% General:   Alert,  elderly, pleasant and cooperative in NAD Skin:  Intact without significant lesions or rashes. Eyes:  Sclera clear, no icterus.   Conjunctiva pink. Ears:  Normal auditory acuity. Nose:  No deformity, discharge,  or lesions. Mouth:  No deformity or lesions. Neck:  Supple; no masses or thyromegaly. No significant cervical adenopathy. Lungs:  Clear throughout to auscultation.   No wheezes, crackles, or rhonchi. No acute distress. Heart:  Regular rate and rhythm; no murmurs, clicks, rubs,  or gallops. Abdomen: Non-distended, normal bowel sounds.  Soft and nontender without appreciable mass or  hepatosplenomegaly.  Pulses:  Normal pulses noted. Extremities:  Without clubbing or edema.  Impression / Recommendations:  Pleasant 81 year old lady with long-standing GERD intermittent esophageal dysphagia. She now presents acutely with what sounds like a esophageal food impaction. Urgent EGD recommended. I doubt were dealing with an underlying occult neoplasm. She could have a ring, stricture or web.  The risks, benefits, limitations, alternatives and imponderables have been reviewed with the patient. Potential for esophageal dilation, biopsy, etc. have also been reviewed.  Questions have been answered. All parties agreeable.If she has quite a bit of inflammation and/or residual food in her upper GI tract, I explained I would not be able to dilate her esophagus today. This would be best done electively in another session in the near future. Her questions have been answered. She is agreeable.  Serum potassium 3.2 today. She will need additional outpatient supplementation.        Notice: This dictation was prepared with Dragon dictation along with smaller phrase technology. Any transcriptional errors that result from this process are unintentional and may not be corrected upon review.

## 2014-09-11 NOTE — Op Note (Signed)
[facility] [address] [place], [address]   55ENDOSCOPY PROCEDURE REPORT  PATIENT: Colleen Roberts, Colleen Roberts  MR#: [id number] BIRTHDATE: 11-Nov-1918 , 89  yrs. old GENDER: female ENDOSCOPIST: R.  Garfield Cornea, MD FACP FACG REFERRED BY:  Sinda Du, M.D. PROCEDURE DATE:  11-Oct-2014 PROCEDURE:  EGD w/ fb removal INDICATIONS:  Esophageal food impaction symptoms. MEDICATIONS: Versed 2.5 mg IV and Demerol 25 mg IV in divided doses. Cetacaine spray orally.  Zofran 4 mg IV. ASA CLASS:      Class III  CONSENT: The risks, benefits, limitations, alternatives and imponderables have been discussed.  The potential for biopsy, esophogeal dilation, etc. have also been reviewed.  Questions have been answered.  All parties agreeable.  Please see the history and physical in the medical record for more information.  DESCRIPTION OF PROCEDURE: After the risks benefits and alternatives of the procedure were thoroughly explained, informed consent was obtained.  The [id number] ([id number]) endoscope was introduced through the mouth and advanced to the second portion of the duodenum , limited by Without limitations. The instrument was slowly withdrawn as the mucosa was fully examined. Estimated blood loss is zero unless otherwise noted in this procedure report.    Moderate size Zenker's diverticulum displaced esophageal lumen. There was a small bit of food debris in the Zenker's which was suctioned out.  I initially could not find the lumen to the esophagus with the adult scope.  I removed the endoscope and obtained the  pediatric gastroscope; I was able to negotiate the UES and intubated the esophagus.  Patient did have food bolus in the proximal esophagus  - appears as quite a bit of mushy food debris.  There was a Schatzki's ring distally.  There was a little nodularity of the esophagus midway with questionable extrinsic compression.  Multiple chunks of soft food in the esophagus  just above this level.  I elected to push these pieces down into the stomach as withdrawing the scope and going back and forth with the endoscope may have bene difficult given the presence of the Zenker's.  The pediatric gastroscope would not accommodate a Roth net.  I pushed multiple pieces of food into the esophagus with the pediatric gastroscope.  A Schatzki's ring was confirmed to be present;  it wasn't particularly critical.  There was some nodularity in mild extrinsic  mass effect of mid esophagus where the food appeared to be hanging.   I did not see an obvious tumor. No attempt at dilation performed today.  Patient tolerated the procedure well.  Retroflexed views revealed as previously described.     The scope was then withdrawn from the patient and the procedure completed.  COMPLICATIONS: There were no immediate complications.  ENDOSCOPIC IMPRESSION: Zenker's diverticulum displacing lumen of esophagus. Proximal esophageal food impaction. Nodularity of possible extrinsic compression midesophagus. Noncritical Schatzki's ring. Incomplete EGD not carried out beyond the proximal stomach.  No dilation performed    Potassium 3.2 today  RECOMMENDATIONS: Full liquid diet. Barium esophagram later this week. Continue omeprazole 20 mg daily.  Swallowing precautions reviewed with patients or in daughter. Patient needs additional potassium supplementation. We'll give her potassium chloride 20 mEq in elixir form - 5 days.   addendum: Daughter states patient has coughed incessantly after eating for many years. That is likely a result of the Zenker's diverticulum.  REPEAT EXAM:  eSigned:  R. Garfield Cornea, MD FACP [facility] 2014/10/11 3:15 PM    CC:  CPT CODES: ICD CODES:  The ICD  and CPT codes recommended by this software are interpretations from the data that the clinical staff has captured with the software.  The verification of the translation of this report to the ICD and  CPT codes and modifiers is the sole responsibility of the health care institution and practicing physician where this report was generated.  [place]. will not be held responsible for the validity of the ICD and CPT codes included on this report.  AMA assumes no liability for data contained or not contained herein. CPT is a Designer, television/film set of the [employer].  PATIENT NAME:  Colleen Roberts, Colleen Roberts MR#: [id number]

## 2014-09-11 NOTE — Discharge Instructions (Addendum)
EGD Discharge instructions Please read the instructions outlined below and refer to this sheet in the next few weeks. These discharge instructions provide you with general information on caring for yourself after you leave the hospital. Your doctor may also give you specific instructions. While your treatment has been planned according to the most current medical practices available, unavoidable complications occasionally occur. If you have any problems or questions after discharge, please call your doctor. ACTIVITY  You may resume your regular activity but move at a slower pace for the next 24 hours.   Take frequent rest periods for the next 24 hours.   Walking will help expel (get rid of) the air and reduce the bloated feeling in your abdomen.   No driving for 24 hours (because of the anesthesia (medicine) used during the test).   You may shower.   Do not sign any important legal documents or operate any machinery for 24 hours (because of the anesthesia used during the test).  NUTRITION  Drink plenty of fluids.   You may resume your normal diet.   Begin with a light meal and progress to your normal diet.   Avoid alcoholic beverages for 24 hours or as instructed by your caregiver.  MEDICATIONS  You may resume your normal medications unless your caregiver tells you otherwise.  WHAT YOU CAN EXPECT TODAY  You may experience abdominal discomfort such as a feeling of fullness or gas pains.  FOLLOW-UP  Your doctor will discuss the results of your test with you.  SEEK IMMEDIATE MEDICAL ATTENTION IF ANY OF THE FOLLOWING OCCUR:  Excessive nausea (feeling sick to your stomach) and/or vomiting.   Severe abdominal pain and distention (swelling).   Trouble swallowing.   Temperature over 101 F (37.8 C).   Rectal bleeding or vomiting of blood.      Swallowing precautions reviewed.  Potassium elixir 20 mEq daily 5 days  Full liquid diet. No meats or bread for  now  Schedule Barium pill esophagram later this week to look at esophagus further.  Full Liquid Diet A full liquid diet may be used:   To help you transition from a clear liquid diet to a soft diet.   When your body is healing and can only tolerate foods that are easy to digest.  Before or after certain a procedure, test, or surgery (such as stomach or intestinal surgeries).   If you have trouble swallowing or chewing.  A full liquid diet includes fluids and foods that are liquid or will become liquid at room temperature. The full liquid diet gives you the proteins, fluids, salts, and minerals that you need for energy. If you continue this diet for more than 72 hours, talk to your health care provider about how many calories you need to consume. If you continue the diet for more than 5 days, talk to your health care provider about taking a multivitamin or a nutritional supplement. WHAT DO I NEED TO KNOW ABOUT A FULL LIQUID DIET?  You may have any liquid.  You may have any food that becomes a liquid at room temperature. The food is considered a liquid if it can be poured off a spoon at room temperature.  Drink one serving of citrus or vitamin C-enriched fruit juice daily. WHAT FOODS CAN I EAT? Grains Any grain food that can be pureed in soup (such as crackers, pasta, and rice). Hot cereal (such as farina or oatmeal) that has been blended. Talk to your health care provider or  dietitian about these foods. Vegetables Pulp-free tomato or vegetable juice. Vegetables pureed in soup.  Fruits Fruit juice, including nectars and juices with pulp. Meats and Other Protein Sources Eggs in custard, eggnog mix, and eggs used in ice cream or pudding. Strained meats, like in baby food, may be allowed. Consult your health care provider.  Dairy Milk and milk-based beverages, including milk shakes and instant breakfast mixes. Smooth yogurt. Pureed cottage cheese. Avoid these foods if they are not well  tolerated. Beverages All beverages, including liquid nutritional supplements. Ask your health care provider if you can have carbonated beverages. They may not be well tolerated. Condiments Iodized salt, pepper, spices, and flavorings. Cocoa powder. Vinegar, ketchup, yellow mustard, smooth sauces (such as hollandaise, cheese sauce, or white sauce), and soy sauce. Sweets and Desserts Custard, smooth pudding. Flavored gelatin. Tapioca, junket. Plain ice cream, sherbet, fruit ices. Frozen ice pops, frozen fudge pops, pudding pops, and other frozen bars with cream. Syrups, including chocolate syrup. Sugar, honey, jelly.  Fats and Oils Margarine, butter, cream, sour cream, and oils. Other Broth and cream soups. Strained, broth-based soups. The items listed above may not be a complete list of recommended foods or beverages. Contact your dietitian for more options.  WHAT FOODS CAN I NOT EAT? Grains All breads. Grains are not allowed unless they are pureed into soup. Vegetables Vegetables are not allowed unless they are juiced, or cooked and pureed into soup. Fruits Fruits are not allowed unless they are juiced. Meats and Other Protein Sources Any meat or fish. Cooked or raw eggs. Nut butters.  Dairy Cheese.  Condiments Stone ground mustards. Fats and Oils Fats that are coarse or chunky. Sweets and Desserts Ice cream or other frozen desserts that have any solids in them or on top, such as nuts, chocolate chips, and pieces of cookies. Cakes. Cookies. Candy. Others Soups with chunks or pieces in them. The items listed above may not be a complete list of foods and beverages to avoid. Contact your dietitian for more information. Document Released: 02/23/2005 Document Revised: 02/28/2013 Document Reviewed: 12/29/2012 [facility] Patient Information 2015 [place], [place]. This information is not intended to replace advice given to you by your health care provider. Make sure you discuss any questions  you have with your health care provider.

## 2014-09-11 NOTE — ED Provider Notes (Signed)
History  This chart was scribed for Orpah Greek, MD by Marlowe Kays, ED Scribe. This patient was seen in room APA09/APA09 and the patient's care was started at 11:16 AM.  Chief Complaint  Patient presents with  . Dysphagia   The history is provided by the patient and medical records. No language interpreter was used.    HPI Comments:  Colleen Roberts is a 81 y.o. female who presents to the Emergency Department complaining of dysphagia that worsened yesterday. Pt reports moderate pain when she tries to swallow. She states she has been having a hard time swallowing for the past month but upon eating a sandwich yesterday she vomited and cannot even swallow her saliva. She denies ever having previous problems in the past. She denies any modifying factors. She denies fever, chills, SOB, nausea or abdominal pain.  Past Medical History  Diagnosis Date  . Cancer     skin  . Hypertension    History reviewed. No pertinent past surgical history. History reviewed. No pertinent family history. History  Substance Use Topics  . Smoking status: Never Smoker   . Smokeless tobacco: Never Used  . Alcohol Use: No   OB History    No data available     Review of Systems  Constitutional: Negative for fever and chills.  HENT: Positive for trouble swallowing.   Respiratory: Negative for shortness of breath.   Gastrointestinal: Positive for vomiting.  All other systems reviewed and are negative.   Allergies  Morphine  Home Medications   Prior to Admission medications   Medication Sig Start Date End Date Taking? Authorizing Provider  amLODipine (NORVASC) 10 MG tablet TAKE 1 TABLET BY MOUTH ONCE A DAY. 07/24/14   Arnoldo Lenis, MD  aspirin 81 MG chewable tablet Chew 81 mg by mouth daily.      Historical Provider, MD  atorvastatin (LIPITOR) 20 MG tablet TAKE (1) TABLET BY MOUTH AT BEDTIME. 05/30/14   Arnoldo Lenis, MD  bumetanide (BUMEX) 0.5 MG tablet Take 1 tablet (0.5 mg  total) by mouth daily as needed. 01/16/14   Arnoldo Lenis, MD  Cholecalciferol (VITAMIN D) 400 UNITS capsule Take 400 Units by mouth daily.      Historical Provider, MD  Coenzyme Q10 (CO Q 10) 100 MG CAPS Take 1 capsule by mouth daily.      Historical Provider, MD  Cyanocobalamin (VITAMIN B 12 PO) Take 1 tablet by mouth daily.      Historical Provider, MD  Homeopathic Products ([employer] LEG CRAMPS PAIN RELIEF) TABS Take 2 tablets by mouth 4 (four) times daily.     Historical Provider, MD  Magnesium 250 MG TABS Take 1 tablet by mouth daily.    Historical Provider, MD  Multiple Vitamins-Minerals (MULTIVITAMIN WITH MINERALS) tablet Take 1 tablet by mouth daily.      Historical Provider, MD  omeprazole (PRILOSEC) 20 MG capsule Take 20 mg by mouth daily.      Historical Provider, MD  potassium chloride SA (K-DUR,KLOR-CON) 20 MEQ tablet Take 1 tablet (20 mEq total) by mouth daily as needed. 01/16/14   Arnoldo Lenis, MD  valsartan-hydrochlorothiazide (DIOVAN HCT) 320-25 MG per tablet Take 1 tablet by mouth daily. 09/26/13   Arnoldo Lenis, MD   Triage Vitals: BP 183/66 mmHg  Pulse 88  Temp(Src) 98.1 F (36.7 C) (Oral)  Resp 19  Ht 4\' 10"  (1.473 m)  Wt 105 lb (47.628 kg)  BMI 21.95 kg/m2  SpO2 98% Physical Exam  Constitutional: She is oriented to person, place, and time. She appears well-developed and well-nourished. No distress.  Spitting saliva out due to inability to swallow.  HENT:  Head: Normocephalic and atraumatic.  Right Ear: Hearing normal.  Left Ear: Hearing normal.  Nose: Nose normal.  Mouth/Throat: Oropharynx is clear and moist and mucous membranes are normal.  Eyes: Conjunctivae and EOM are normal. Pupils are equal, round, and reactive to light.  Neck: Normal range of motion. Neck supple.  Cardiovascular: Normal rate, regular rhythm, S1 normal, S2 normal and normal heart sounds.  Exam reveals no gallop and no friction rub.   No murmur heard. Pulmonary/Chest: Effort normal  and breath sounds normal. No respiratory distress. She exhibits no tenderness.  Abdominal: Soft. Normal appearance and bowel sounds are normal. There is no hepatosplenomegaly. There is no tenderness. There is no rebound, no guarding, no tenderness at McBurney's point and negative Murphy's sign. No hernia.  Musculoskeletal: Normal range of motion.  Neurological: She is alert and oriented to person, place, and time. She has normal strength. No cranial nerve deficit or sensory deficit. Coordination normal. GCS eye subscore is 4. GCS verbal subscore is 5. GCS motor subscore is 6.  Skin: Skin is warm, dry and intact. No rash noted. No cyanosis.  Psychiatric: She has a normal mood and affect. Her speech is normal and behavior is normal. Thought content normal.    ED Course  Procedures (including critical care time) DIAGNOSTIC STUDIES: Oxygen Saturation is 98% on RA, normal by my interpretation.   COORDINATION OF CARE: 11:22 AM- Will order an X-Ray and contact GI specialist. Pt verbalizes understanding and agrees to plan.  Medications - No data to display  Labs Review Labs Reviewed - No data to display  Imaging Review No results found.   EKG Interpretation None      MDM   Final diagnoses:  None   esophageal impaction  Patient presents to the ER for evaluation of difficulty swallowing. She reports that over the last month she has noticed difficulty swallowing her foods, but starting yesterday she has been unable to swallow anything. Patient was encountered in room having difficulty swallowing her own saliva. This is consistent with esophageal impaction. Case discussed with Dr. Gala Romney, will undergo endoscopy.  I personally performed the services described in this documentation, which was scribed in my presence. The recorded information has been reviewed and is accurate.    Orpah Greek, MD 09/11/14 1144

## 2014-09-11 NOTE — ED Notes (Signed)
Endo staff picked pt up for procedure.

## 2014-09-14 ENCOUNTER — Ambulatory Visit (HOSPITAL_COMMUNITY)
Admission: RE | Admit: 2014-09-14 | Discharge: 2014-09-14 | Disposition: A | Discharge status: Home / Self Care | Payer: Medicare Other | Source: Ambulatory Visit | Attending: Internal Medicine | Admitting: Internal Medicine

## 2014-09-14 ENCOUNTER — Other Ambulatory Visit: Payer: Self-pay

## 2014-09-14 DIAGNOSIS — K225 Diverticulum of esophagus, acquired: Secondary | ICD-10-CM | POA: Diagnosis not present

## 2014-09-14 DIAGNOSIS — R1310 Dysphagia, unspecified: Secondary | ICD-10-CM | POA: Diagnosis present

## 2014-09-14 DIAGNOSIS — R9389 Abnormal findings on diagnostic imaging of other specified body structures: Secondary | ICD-10-CM

## 2014-09-14 DIAGNOSIS — K222 Esophageal obstruction: Secondary | ICD-10-CM | POA: Diagnosis not present

## 2014-09-14 DIAGNOSIS — R933 Abnormal findings on diagnostic imaging of other parts of digestive tract: Secondary | ICD-10-CM | POA: Insufficient documentation

## 2014-09-14 DIAGNOSIS — R05 Cough: Secondary | ICD-10-CM | POA: Diagnosis not present

## 2014-09-14 DIAGNOSIS — K224 Dyskinesia of esophagus: Secondary | ICD-10-CM | POA: Insufficient documentation

## 2014-09-14 DIAGNOSIS — R1314 Dysphagia, pharyngoesophageal phase: Secondary | ICD-10-CM

## 2014-09-18 ENCOUNTER — Telehealth: Payer: Self-pay

## 2014-09-18 ENCOUNTER — Ambulatory Visit (HOSPITAL_COMMUNITY)
Admission: RE | Admit: 2014-09-18 | Discharge: 2014-09-18 | Disposition: A | Discharge status: Home / Self Care | Payer: Medicare Other | Source: Ambulatory Visit | Attending: Internal Medicine | Admitting: Internal Medicine

## 2014-09-18 DIAGNOSIS — Z7983 Long term (current) use of bisphosphonates: Secondary | ICD-10-CM

## 2014-09-18 DIAGNOSIS — K449 Diaphragmatic hernia without obstruction or gangrene: Secondary | ICD-10-CM | POA: Diagnosis present

## 2014-09-18 DIAGNOSIS — R079 Chest pain, unspecified: Secondary | ICD-10-CM | POA: Diagnosis not present

## 2014-09-18 DIAGNOSIS — R918 Other nonspecific abnormal finding of lung field: Secondary | ICD-10-CM | POA: Insufficient documentation

## 2014-09-18 DIAGNOSIS — R9389 Abnormal findings on diagnostic imaging of other specified body structures: Secondary | ICD-10-CM

## 2014-09-18 DIAGNOSIS — R932 Abnormal findings on diagnostic imaging of liver and biliary tract: Secondary | ICD-10-CM | POA: Insufficient documentation

## 2014-09-18 DIAGNOSIS — Z885 Allergy status to narcotic agent status: Secondary | ICD-10-CM

## 2014-09-18 DIAGNOSIS — K223 Perforation of esophagus: Secondary | ICD-10-CM | POA: Diagnosis present

## 2014-09-18 DIAGNOSIS — R1310 Dysphagia, unspecified: Secondary | ICD-10-CM | POA: Insufficient documentation

## 2014-09-18 DIAGNOSIS — Z951 Presence of aortocoronary bypass graft: Secondary | ICD-10-CM

## 2014-09-18 DIAGNOSIS — Z515 Encounter for palliative care: Secondary | ICD-10-CM

## 2014-09-18 DIAGNOSIS — Z66 Do not resuscitate: Secondary | ICD-10-CM | POA: Diagnosis present

## 2014-09-18 DIAGNOSIS — R933 Abnormal findings on diagnostic imaging of other parts of digestive tract: Secondary | ICD-10-CM | POA: Insufficient documentation

## 2014-09-18 DIAGNOSIS — Z7982 Long term (current) use of aspirin: Secondary | ICD-10-CM

## 2014-09-18 DIAGNOSIS — R05 Cough: Secondary | ICD-10-CM | POA: Insufficient documentation

## 2014-09-18 DIAGNOSIS — K222 Esophageal obstruction: Secondary | ICD-10-CM | POA: Diagnosis present

## 2014-09-18 DIAGNOSIS — I2510 Atherosclerotic heart disease of native coronary artery without angina pectoris: Secondary | ICD-10-CM | POA: Diagnosis present

## 2014-09-18 DIAGNOSIS — I10 Essential (primary) hypertension: Secondary | ICD-10-CM | POA: Diagnosis present

## 2014-09-18 DIAGNOSIS — R110 Nausea: Secondary | ICD-10-CM | POA: Diagnosis not present

## 2014-09-18 DIAGNOSIS — C159 Malignant neoplasm of esophagus, unspecified: Principal | ICD-10-CM | POA: Diagnosis present

## 2014-09-18 DIAGNOSIS — Z79899 Other long term (current) drug therapy: Secondary | ICD-10-CM

## 2014-09-18 MED ORDER — IOHEXOL 300 MG/ML  SOLN
75.0000 mL | Freq: Once | INTRAMUSCULAR | Status: AC | PRN
  Administered 2014-09-18: 75 mL via INTRAVENOUS

## 2014-09-18 NOTE — Telephone Encounter (Signed)
Received a phone call for the results of her CT Chest. I gave RMR a copy of the report.

## 2014-09-18 NOTE — Telephone Encounter (Signed)
CT does show an abnormality midesophagus which is concerning. This is where the food was hung at recent EGD. Patient needs a repeat EGD electively but urgently this week for biopsy and further assessment. Please arrange.

## 2014-09-18 NOTE — Telephone Encounter (Signed)
Pt is set for 2:00pm on 09/21/2014 with RMR. Spoke with Daughter and she is aware.

## 2014-09-18 NOTE — Telephone Encounter (Signed)
pts daughter- Melitza Metheny is aware. She would like Candy to call her about scheduling her mothers procedure.

## 2014-09-19 ENCOUNTER — Other Ambulatory Visit: Payer: Self-pay

## 2014-09-19 DIAGNOSIS — R933 Abnormal findings on diagnostic imaging of other parts of digestive tract: Secondary | ICD-10-CM

## 2014-09-21 ENCOUNTER — Inpatient Hospital Stay (HOSPITAL_COMMUNITY): Payer: Medicare Other

## 2014-09-21 ENCOUNTER — Inpatient Hospital Stay (HOSPITAL_COMMUNITY)
Admission: RE | Admit: 2014-09-21 | Discharge: 2014-09-27 | DRG: 374 | Disposition: A | Discharge status: Hospice | Payer: Medicare Other | Source: Ambulatory Visit | Attending: Internal Medicine | Admitting: Internal Medicine

## 2014-09-21 ENCOUNTER — Encounter (HOSPITAL_COMMUNITY): Payer: Self-pay | Admitting: *Deleted

## 2014-09-21 ENCOUNTER — Encounter (HOSPITAL_COMMUNITY)
Admission: RE | Disposition: A | Discharge status: Hospice | Payer: Self-pay | Source: Ambulatory Visit | Attending: Internal Medicine

## 2014-09-21 DIAGNOSIS — R1314 Dysphagia, pharyngoesophageal phase: Secondary | ICD-10-CM | POA: Diagnosis not present

## 2014-09-21 DIAGNOSIS — I10 Essential (primary) hypertension: Secondary | ICD-10-CM | POA: Diagnosis present

## 2014-09-21 DIAGNOSIS — K222 Esophageal obstruction: Secondary | ICD-10-CM

## 2014-09-21 DIAGNOSIS — Z515 Encounter for palliative care: Secondary | ICD-10-CM | POA: Insufficient documentation

## 2014-09-21 DIAGNOSIS — I2510 Atherosclerotic heart disease of native coronary artery without angina pectoris: Secondary | ICD-10-CM | POA: Diagnosis present

## 2014-09-21 DIAGNOSIS — D379 Neoplasm of uncertain behavior of digestive organ, unspecified: Secondary | ICD-10-CM | POA: Diagnosis not present

## 2014-09-21 DIAGNOSIS — Z7983 Long term (current) use of bisphosphonates: Secondary | ICD-10-CM | POA: Diagnosis not present

## 2014-09-21 DIAGNOSIS — Z951 Presence of aortocoronary bypass graft: Secondary | ICD-10-CM | POA: Diagnosis not present

## 2014-09-21 DIAGNOSIS — D490 Neoplasm of unspecified behavior of digestive system: Secondary | ICD-10-CM

## 2014-09-21 DIAGNOSIS — Z7982 Long term (current) use of aspirin: Secondary | ICD-10-CM | POA: Diagnosis not present

## 2014-09-21 DIAGNOSIS — Z885 Allergy status to narcotic agent status: Secondary | ICD-10-CM | POA: Diagnosis not present

## 2014-09-21 DIAGNOSIS — K223 Perforation of esophagus: Secondary | ICD-10-CM

## 2014-09-21 DIAGNOSIS — Z66 Do not resuscitate: Secondary | ICD-10-CM | POA: Diagnosis present

## 2014-09-21 DIAGNOSIS — R933 Abnormal findings on diagnostic imaging of other parts of digestive tract: Secondary | ICD-10-CM | POA: Diagnosis not present

## 2014-09-21 DIAGNOSIS — R079 Chest pain, unspecified: Secondary | ICD-10-CM | POA: Diagnosis not present

## 2014-09-21 DIAGNOSIS — C159 Malignant neoplasm of esophagus, unspecified: Secondary | ICD-10-CM | POA: Diagnosis present

## 2014-09-21 DIAGNOSIS — Z79899 Other long term (current) drug therapy: Secondary | ICD-10-CM | POA: Diagnosis not present

## 2014-09-21 DIAGNOSIS — R110 Nausea: Secondary | ICD-10-CM | POA: Insufficient documentation

## 2014-09-21 DIAGNOSIS — K449 Diaphragmatic hernia without obstruction or gangrene: Secondary | ICD-10-CM | POA: Diagnosis present

## 2014-09-21 HISTORY — PX: ESOPHAGOGASTRODUODENOSCOPY: SHX5428

## 2014-09-21 SURGERY — EGD (ESOPHAGOGASTRODUODENOSCOPY)
Anesthesia: Moderate Sedation

## 2014-09-21 MED ORDER — ATORVASTATIN CALCIUM 20 MG PO TABS: 20.0000 mg | ORAL_TABLET | Freq: Every day | ORAL | Status: DC

## 2014-09-21 MED ORDER — VITAMIN B-12 100 MCG PO TABS: 100.0000 ug | ORAL_TABLET | Freq: Every day | ORAL | Status: DC

## 2014-09-21 MED ORDER — VALSARTAN-HYDROCHLOROTHIAZIDE 320-25 MG PO TABS: 1.0000 | ORAL_TABLET | Freq: Every day | ORAL | Status: DC

## 2014-09-21 MED ORDER — MEPERIDINE HCL 100 MG/ML IJ SOLN
INTRAMUSCULAR | Status: AC
  Filled 2014-09-21: qty 2

## 2014-09-21 MED ORDER — PIPERACILLIN-TAZOBACTAM 3.375 G IVPB
3.3750 g | Freq: Three times a day (TID) | INTRAVENOUS | Status: DC
  Administered 2014-09-22 – 2014-09-27 (×17): 3.375 g via INTRAVENOUS
  Filled 2014-09-21 (×20): qty 50

## 2014-09-21 MED ORDER — AMLODIPINE BESYLATE 10 MG PO TABS: 10.0000 mg | ORAL_TABLET | Freq: Every day | ORAL | Status: DC

## 2014-09-21 MED ORDER — HYDRALAZINE HCL 20 MG/ML IJ SOLN
5.0000 mg | INTRAMUSCULAR | Status: DC | PRN
  Administered 2014-09-25 – 2014-09-27 (×5): 5 mg via INTRAVENOUS
  Filled 2014-09-21 (×5): qty 1

## 2014-09-21 MED ORDER — MIDAZOLAM HCL 5 MG/5ML IJ SOLN
INTRAMUSCULAR | Status: AC
  Filled 2014-09-21: qty 5

## 2014-09-21 MED ORDER — ONDANSETRON HCL 4 MG/2ML IJ SOLN
INTRAMUSCULAR | Status: DC | PRN
  Administered 2014-09-21: 4 mg via INTRAVENOUS

## 2014-09-21 MED ORDER — ONDANSETRON HCL 4 MG/2ML IJ SOLN
4.0000 mg | Freq: Four times a day (QID) | INTRAMUSCULAR | Status: DC | PRN
  Administered 2014-09-21 – 2014-09-22 (×3): 4 mg via INTRAVENOUS
  Filled 2014-09-21 (×3): qty 2

## 2014-09-21 MED ORDER — SODIUM CHLORIDE 0.9 % IV SOLN
INTRAVENOUS | Status: DC
  Administered 2014-09-21: 14:00:00 via INTRAVENOUS

## 2014-09-21 MED ORDER — VITAMIN D 400 UNITS PO CAPS: 400.0000 [IU] | ORAL_CAPSULE | Freq: Every day | ORAL | Status: DC

## 2014-09-21 MED ORDER — LIDOCAINE VISCOUS 2 % MT SOLN
OROMUCOSAL | Status: AC
  Filled 2014-09-21: qty 15

## 2014-09-21 MED ORDER — MIDAZOLAM HCL 5 MG/5ML IJ SOLN
INTRAMUSCULAR | Status: DC | PRN
  Administered 2014-09-21: 2 mg via INTRAVENOUS
  Administered 2014-09-21: 1 mg via INTRAVENOUS
  Administered 2014-09-21: 0.5 mg via INTRAVENOUS
  Administered 2014-09-21 (×2): 1 mg via INTRAVENOUS
  Administered 2014-09-21: 0.5 mg via INTRAVENOUS

## 2014-09-21 MED ORDER — MIDAZOLAM HCL 5 MG/5ML IJ SOLN
INTRAMUSCULAR | Status: AC
Start: 2014-09-21 — End: 2014-09-22
  Filled 2014-09-21: qty 10

## 2014-09-21 MED ORDER — ENOXAPARIN SODIUM 40 MG/0.4ML ~~LOC~~ SOLN: 40.0000 mg | SUBCUTANEOUS | Status: DC

## 2014-09-21 MED ORDER — PANTOPRAZOLE SODIUM 40 MG PO TBEC: 40.0000 mg | DELAYED_RELEASE_TABLET | Freq: Every day | ORAL | Status: DC

## 2014-09-21 MED ORDER — MAGNESIUM 250 MG PO TABS: 1.0000 | ORAL_TABLET | Freq: Every day | ORAL | Status: DC

## 2014-09-21 MED ORDER — SODIUM CHLORIDE 0.9 % IJ SOLN: 3.0000 mL | Freq: Two times a day (BID) | INTRAMUSCULAR | Status: DC

## 2014-09-21 MED ORDER — STERILE WATER FOR IRRIGATION IR SOLN
Status: DC | PRN
  Administered 2014-09-21: 15:00:00

## 2014-09-21 MED ORDER — ONDANSETRON HCL 4 MG/2ML IJ SOLN
INTRAMUSCULAR | Status: AC
Start: 2014-09-21 — End: 2014-09-22
  Filled 2014-09-21: qty 2

## 2014-09-21 MED ORDER — MULTI-VITAMIN/MINERALS PO TABS: 1.0000 | ORAL_TABLET | Freq: Every day | ORAL | Status: DC

## 2014-09-21 MED ORDER — ACETAMINOPHEN 325 MG RE SUPP
325.0000 mg | Freq: Four times a day (QID) | RECTAL | Status: DC | PRN
  Administered 2014-09-21: 325 mg via RECTAL
  Filled 2014-09-21 (×3): qty 1

## 2014-09-21 MED ORDER — SODIUM CHLORIDE 0.9 % IV SOLN: INTRAVENOUS | Status: DC

## 2014-09-21 MED ORDER — LIDOCAINE VISCOUS 2 % MT SOLN
OROMUCOSAL | Status: DC | PRN
  Administered 2014-09-21 (×2): 3 mL via OROMUCOSAL

## 2014-09-21 MED ORDER — MEPERIDINE HCL 100 MG/ML IJ SOLN
INTRAMUSCULAR | Status: DC | PRN
  Administered 2014-09-21 (×2): 25 mg via INTRAVENOUS

## 2014-09-21 MED ORDER — SODIUM CHLORIDE 0.9 % IV SOLN
INTRAVENOUS | Status: DC
  Administered 2014-09-21: 18:00:00 via INTRAVENOUS
  Administered 2014-09-22: 100 mL/h via INTRAVENOUS
  Administered 2014-09-22 – 2014-09-24 (×3): via INTRAVENOUS
  Administered 2014-09-25: 1000 mL via INTRAVENOUS

## 2014-09-21 MED ORDER — FENTANYL CITRATE (PF) 100 MCG/2ML IJ SOLN
12.5000 ug | INTRAMUSCULAR | Status: DC | PRN
  Administered 2014-09-21 – 2014-09-27 (×10): 25 ug via INTRAVENOUS
  Filled 2014-09-21 (×10): qty 2

## 2014-09-21 MED ORDER — ONDANSETRON HCL 4 MG PO TABS
4.0000 mg | ORAL_TABLET | Freq: Four times a day (QID) | ORAL | Status: DC | PRN
  Filled 2014-09-21: qty 1

## 2014-09-21 MED ORDER — PIPERACILLIN-TAZOBACTAM 3.375 G IVPB 30 MIN
3.3750 g | Freq: Once | INTRAVENOUS | Status: AC
  Administered 2014-09-21: 3.375 g via INTRAVENOUS
  Filled 2014-09-21: qty 50

## 2014-09-21 NOTE — Progress Notes (Signed)
Colleen Magic NP paged to make aware that patient has arrived from [facility], patient c/o chest pain where the stent is placed, needs pain med ordered as well.[facility] [employer]

## 2014-09-21 NOTE — Progress Notes (Signed)
ANTIBIOTIC CONSULT NOTE - INITIAL  Pharmacy Consult for Zosyn Indication: esophageal perforation  Allergies  Allergen Reactions  . Morphine Other (See Comments)    Nearly died per patient    Patient Measurements: Height: 4\' 10"  (147.3 cm) Weight: 105 lb 3.2 oz (47.718 kg) IBW/kg (Calculated) : 40.9  Vital Signs: Temp: 98.1 F (36.7 C) (07/15 2152) Temp Source: Oral (07/15 2152) BP: 160/61 mmHg (07/15 2152) Pulse Rate: 84 (07/15 2152) Intake/Output from previous day:   Intake/Output from this shift:    Labs: No results for input(s): WBC, HGB, PLT, LABCREA, CREATININE in the last 72 hours. Estimated Creatinine Clearance: 28.4 mL/min (by C-G formula based on Cr of 0.78). No results for input(s): VANCOTROUGH, VANCOPEAK, VANCORANDOM, GENTTROUGH, GENTPEAK, GENTRANDOM, TOBRATROUGH, TOBRAPEAK, TOBRARND, AMIKACINPEAK, AMIKACINTROU, AMIKACIN in the last 72 hours.   Microbiology: No results found for this or any previous visit (from the past 720 hour(s)).  Medical History: Past Medical History  Diagnosis Date  . Cancer     skin  . Hypertension     Medications:  Prescriptions prior to admission  Medication Sig Dispense Refill Last Dose  . alendronate (FOSAMAX) 70 MG tablet Take 70 mg by mouth once a week. Take with a full glass of water on an empty stomach.   09/15/2014  . amLODipine (NORVASC) 10 MG tablet TAKE 1 TABLET BY MOUTH ONCE A DAY. 60 tablet 6 09/20/2014 at Unknown time  . aspirin 81 MG chewable tablet Chew 81 mg by mouth daily.     09/20/2014 at Unknown time  . atorvastatin (LIPITOR) 20 MG tablet TAKE (1) TABLET BY MOUTH AT BEDTIME. 90 tablet 3 09/20/2014 at Unknown time  . bumetanide (BUMEX) 0.5 MG tablet Take 1 tablet (0.5 mg total) by mouth daily as needed. (Patient taking differently: Take 0.5 mg by mouth daily as needed (fluid). ) 90 tablet 3 unknown at Unknown time  . calcium carbonate (OS-CAL - DOSED IN MG OF ELEMENTAL CALCIUM) 1250 (500 CA) MG tablet Take 1 tablet  by mouth daily.   Past Month at Unknown time  . Cholecalciferol (VITAMIN D) 400 UNITS capsule Take 400 Units by mouth daily.     Past Month at Unknown time  . Coenzyme Q10 (COQ10 PO) Take 1 tablet by mouth daily.   Past Month at Unknown time  . Cyanocobalamin (VITAMIN B-12 PO) Take 1 tablet by mouth daily.   Past Month at Unknown time  . Homeopathic Products ([employer] LEG CRAMPS PAIN RELIEF) TABS Take 2 tablets by mouth 4 (four) times daily.    09/20/2014 at Unknown time  . Magnesium 250 MG TABS Take 1 tablet by mouth daily.   Past Month at Unknown time  . Multiple Vitamins-Minerals (MULTIVITAMIN WITH MINERALS) tablet Take 1 tablet by mouth daily.     Past Month at Unknown time  . omeprazole (PRILOSEC) 20 MG capsule Take 20 mg by mouth daily.     09/20/2014 at Unknown time  . potassium chloride SA (K-DUR,KLOR-CON) 20 MEQ tablet Take 1 tablet (20 mEq total) by mouth daily as needed. 90 tablet 3 Past Month at Unknown time  . valsartan-hydrochlorothiazide (DIOVAN HCT) 320-25 MG per tablet Take 1 tablet by mouth daily. 90 tablet 3 09/20/2014 at Unknown time   Assessment: 81 y.o. female presented to [facility] with esophageal perforation s/p esophageal stent placement. Transferred to [facility] for GI, thoracic surgery consults. To continue empiric Zosyn for esophageal perforation. Zosyn 3.375gm IV given ~1600. Afeb. Last SCr 0.78 on 7/5, est CrCl  28 ml/min  Goal of Therapy:  Resolution of infection  Plan:  Zosyn 3.375gm IV q8h - each dose over 4 hours Will f/u micro data, pt's clinical condition, and renal function  Sherlon Handing, PharmD, BCPS Clinical pharmacist, pager [phone] 09/21/2014,11:00 PM

## 2014-09-21 NOTE — H&P (Addendum)
[facility] History and Physical  TANITA PALINKAS ZOX:[id number] DOB: Jun 13, 1918 DOA: 09/21/2014  Referring physician: Dr. Gala Romney PCP: Alonza Bogus, MD   Chief Complaint: esophageal perforation  HPI: Colleen Roberts is a 81 y.o. female  This is a 81 year old lady who is post esophageal stent in the endoscopy suite. I was asked to see the patient by gastrin control G, Dr. Gala Romney. 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 Dr. Gala Romney has spoken with thoracic surgery and it is felt that the patient would be better served transferring to [facility] due to lack of gastroenterology services this weekend. Thoracic surgery did not feel that she was a surgical candidate.   Review of Systems:  Unable to obtain review of systems since the patient is  postprocedure and is somewhat sedated.  Past Medical History  Diagnosis Date  . Cancer     skin  . Hypertension    Past Surgical History  Procedure Laterality Date  . Cholecystectomy    . Coronary artery bypass graft    . Eye surgery Bilateral   . Tonsillectomy    . Thyroid surgery     Social History:  reports that she has never smoked. She has never used smokeless tobacco. She reports that she does not drink alcohol or use illicit drugs.  Allergies  Allergen Reactions  . Morphine Other (See Comments)    Nearly died per patient    Family history: unable to obtain family history secondary to the patient's sedation postprocedure.   Prior to Admission medications   Medication Sig Start Date End Date Taking? Authorizing Provider  alendronate (FOSAMAX) 70 MG tablet Take 70 mg by mouth once a week. Take with a full glass of water on an empty stomach.   Yes Historical Provider, MD  amLODipine (NORVASC) 10 MG tablet TAKE 1 TABLET BY MOUTH  ONCE A DAY. 07/24/14  Yes Arnoldo Lenis, MD  aspirin 81 MG chewable tablet Chew 81 mg by mouth daily.     Yes Historical Provider, MD  atorvastatin (LIPITOR) 20 MG tablet TAKE (1) TABLET BY MOUTH AT BEDTIME. 05/30/14  Yes Arnoldo Lenis, MD  bumetanide (BUMEX) 0.5 MG tablet Take 1 tablet (0.5 mg total) by mouth daily as needed. Patient taking differently: Take 0.5 mg by mouth daily as needed (fluid).  01/16/14  Yes Arnoldo Lenis, MD  calcium carbonate (OS-CAL - DOSED IN MG OF ELEMENTAL CALCIUM) 1250 (500 CA) MG tablet Take 1 tablet by mouth daily.   Yes Historical Provider, MD  Cholecalciferol (VITAMIN D) 400 UNITS capsule Take 400 Units by mouth daily.     Yes Historical Provider, MD  Coenzyme Q10 (COQ10 PO) Take 1 tablet by mouth daily.   Yes Historical Provider, MD  Cyanocobalamin (VITAMIN B-12 PO) Take 1 tablet by mouth daily.   Yes Historical Provider, MD  Homeopathic Products ([employer] LEG CRAMPS PAIN RELIEF) TABS Take 2 tablets by mouth 4 (four) times daily.    Yes Historical Provider, MD  Magnesium 250 MG TABS Take 1 tablet by mouth daily.   Yes Historical Provider, MD  Multiple Vitamins-Minerals (MULTIVITAMIN WITH MINERALS) tablet Take 1 tablet by mouth daily.     Yes Historical Provider, MD  omeprazole (PRILOSEC) 20 MG capsule Take 20 mg by mouth daily.     Yes Historical Provider,  MD  potassium chloride SA (K-DUR,KLOR-CON) 20 MEQ tablet Take 1 tablet (20 mEq total) by mouth daily as needed. 01/16/14  Yes Arnoldo Lenis, MD  valsartan-hydrochlorothiazide (DIOVAN HCT) 320-25 MG per tablet Take 1 tablet by mouth daily. 09/26/13  Yes Arnoldo Lenis, MD   Physical Exam: Filed Vitals:   09/21/14 1735 09/21/14 1740 09/21/14 1745 09/21/14 1750  BP: 171/62 147/62 165/60   Pulse: 73 75 79 78  Temp:      TempSrc:      Resp: 19 16 20 22   Height:      Weight:      SpO2: 99% 98% 99% 100%    Wt Readings from Last 3 Encounters:  09/21/14 47.628 kg (105 lb)  09/11/14 47.628 kg (105  lb)  05/14/14 49.533 kg (109 lb 3.2 oz)    General:  Appears drowsy. She does open her eyes to her  Name. Eyes: PERRL, normal lids, irises & conjunctiva ENT: grossly normal hearing, lips & tongue Neck: no LAD, masses or thyromegaly Cardiovascular: RRR, no m/r/g. No LE edema. Telemetry: SR, no arrhythmias  Respiratory: CTA bilaterally, no w/r/r. Normal respiratory effort. Abdomen: soft, ntnd Skin: no rash or induration seen on limited exam Musculoskeletal: grossly normal tone BUE/BLE Psychiatric: not examined. Neurologic: grossly non-focal.          Labs on Admission:  Basic Metabolic Panel: No results for input(s): NA, K, CL, CO2, GLUCOSE, BUN, CREATININE, CALCIUM, MG, PHOS in the last 168 hours. Liver Function Tests: No results for input(s): AST, ALT, ALKPHOS, BILITOT, PROT, ALBUMIN in the last 168 hours. No results for input(s): LIPASE, AMYLASE in the last 168 hours. No results for input(s): AMMONIA in the last 168 hours. CBC: No results for input(s): WBC, NEUTROABS, HGB, HCT, MCV, PLT in the last 168 hours. Cardiac Enzymes: No results for input(s): CKTOTAL, CKMB, CKMBINDEX, TROPONINI in the last 168 hours.  BNP (last 3 results) No results for input(s): BNP in the last 8760 hours.  ProBNP (last 3 results) No results for input(s): PROBNP in the last 8760 hours.  CBG: No results for input(s): GLUCAP in the last 168 hours.  Radiological Exams on Admission: No results found.    Assessment/Plan   1. Esophageal stricture with esophageal perforation. Intravenous Zosyn. Nothing by mouth. IV fluids. The patient will need gastroenterology and thoracic surgery consultation, once discussion is done with family. 2. Hypertension. Blood pressure was somewhat elevated during the procedure. We will control this with intravenous hydralazine when necessary. 3. Coronary atherosclerosis. Appears to be stable. Obtain electrocardiogram.  Further recommendations will depend on patient's  hospital progress.   Code Status: full code for the time being. The patient will benefit from palliative care consultation to discuss diagnosis and prognosis with family and patient and address CODE STATUS at this point.  DVT Prophylaxis:SCDs.  Family Communication: no family members present currently.   Disposition Plan: depending on progress.   Time spent: 45 minutes.  Doree Albee [facility] Pager [phone].

## 2014-09-21 NOTE — Op Note (Signed)
[facility] [address] [place], [address]   ENDOSCOPY PROCEDURE REPORT  PATIENT: Colleen Roberts, Colleen Roberts  MR#: [id number] BIRTHDATE: Feb 26, 1919 , 89  yrs. old GENDER: female ENDOSCOPIST: R.  Garfield Cornea, MD Quentin Ore Assistant: Dr. Laural Golden REFERRED BY:  Sinda Du, M.D. PROCEDURE DATE:  09/21/2014 PROCEDURE:     EGD with esophageal mass biopsy, expandable covered esophageal Wallstent placement. INDICATIONS:  esophageal dysphagia; esophageal mass seen on chest CT; recent food impaction. MEDICATIONS: Versed 6 mg IV and Demerol 50 mg IV in divided doses. Zofran 4 mg IV. Xylocaine gel orally. ASA CLASS:      Class III  CONSENT: The risks, benefits, limitations, alternatives and imponderables have been discussed.  The potential for biopsy, esophogeal dilation, etc. have also been reviewed.  Questions have been answered.  All parties agreeable.  Please see the history and physical in the medical record for more information.  DESCRIPTION OF PROCEDURE: After the risks benefits and alternatives of the procedure were thoroughly explained, informed consent was obtained.  The [id number] ([id number]) endoscope was introduced through the mouth and advanced to the second portion of the duodenum , limited by Without limitations.   Examination of the tubular esophagus revealed a nearly circumferential somewhat exophytic mass - midesophagus producing significant encroachment on the lumen for about 3 cm in length.  I was able to easily advance the scope beyond this lesion and inspect the distal esophagus and on into the stomach.  The gastric cavity was empty.  It insufflated well with air.  Small hiatal hernia present.  The gastric mucosa was normal.  Patent pylorus. Examination of the bulb and second portion revealed no abnormalities.  I pulled the scope back up into the esophagus across the neoplastic lesion and noticed what appeared to be 2 lumens.  However, it was readily  apparent there was a perforation present.  About 2-2.5 cm in length. I removed the scope.  I spoke to Dr.  Roxy Manns and Dr.  Servando Snare in [place] about the case. They both felt that given the presence of an esophageal mass and her advanced age, she would not be a surgical candidate in any way. It was unanimously felt that placement of a covered esophageal Wallstent would be the best approach with nothing by mouth status and IV antibodies. I Spoke with the pharmacy.  Stat dose of IV Zosyn 3.375 g IV ordered and infused. Wallstent retrieved from [place]. procedure interrupted for several minutes A new timeout was done.  Dr.  Recardo Evangelist with the stent placement: Scope was reintroduced into the proximal esophagus abdomen level of the esophageal tumor, the lumen was displaced laterally. the lumen of the esophagus was somewhat displaced laterally and there appeared to be somewhat of a mature cavity in association with the luminal disruption.  This defect was about 3 cm in length.  I advanced the scope through the esophagus into the stomach.  A 360 radiopaque hemostasis clip was placed at the level of the diaphragmatic hiatus.  Scope was pulled back just distally under fluoroscopic control and this location was marked with a clip externally scope was pulled back further and a clip was placed just proximally to the proximal extent of the tumor.  Subsequently,I advanced a guidewire into the distal stomach and scope was removed, leaving the wire in place under fluoroscopic control.  An 18 mm -103 mm covered expandable Wallstent system was advanced over the guidewire under fluoroscopic control using preplaced markers as a reference point.  The  stent was deployed.  The distal aspect of the stent appeared to be above the diaphragmatic hiatus and the proximal end of appeared to be located satisfactorily above the level of the mass and perforation.  Patient tolerated the procedure  well.  .  COMPLICATIONS: perforation appreciated at the time of the EGD treated as above. 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 Perforation appreciated at time of procedure. Status post esophageal mass biopsy and expandable covered Wallstent placement as described above.  I had multiple conversations with multiple family members throughout the above sequence of events. After some contemplation and after speaking to the hospitalist here , it was decided patient should be admitted at [facility] where a full complement of specialist would be available after hours on the weekends to address any problems that may arise in her aftercare.  RECOMMENDATIONS: Continue nothing by mouth status. Continue IV Zosyn. Transfer to [place] service here has facilitate the transfer and that is appreciated. Family kept abreast of the all interventions and was agreeable to transfer. They understand the gravity of the situation. Patient appears to have an incurablecondition.  REPEAT EXAM:  eSigned:  R. Garfield Cornea, MD Rosalita Chessman [facility] 01-Oct-2014 6:20 PM    CC:  CPT CODES: ICD CODES:  The ICD and CPT codes recommended by this software are interpretations from the data that the clinical staff has captured with the software.  The verification of the translation of this report to the ICD and CPT codes and modifiers is the sole responsibility of the health care institution and practicing physician where this report was generated.  [employer] will not be held responsible for the validity of the ICD and CPT codes included on this report.  AMA assumes no liability for data contained or not contained herein. CPT is a Designer, television/film set of the [employer].  PATIENT NAME:  Colleen Roberts MR#: [id number]

## 2014-09-21 NOTE — Progress Notes (Signed)
Report called to [place] for room [phone]. [facility] transported patient to [facility].

## 2014-09-21 NOTE — H&P (Signed)
@LOGO @   Primary Care Physician:  Alonza Bogus, MD Primary Gastroenterologist:  Dr. Gala Romney  Pre-Procedure History & Physical: HPI:  Colleen Roberts is a 81 y.o. female here for follow-up of abnormal esophagram and chest CT. Presented recently with food impaction. Was found to have a Zenker's and quite a bit of food debris in the mid esophagus with question of extrinsic compression at the mid esophageal level. Complete EGD not carried out. Barium esophagram followed up which revealed a narrowing in the mid esophagus which held up the barium pill. She also had a very small Zenker's diverticulum. 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 Plans now being made to bring her back for repeat EGD and biopsy under elective circumstances with an empty upper GI tract.  Past Medical History  Diagnosis Date  . Cancer     skin  . Hypertension     Past Surgical History  Procedure Laterality Date  . Cholecystectomy    . Coronary artery bypass graft    . Eye surgery Bilateral   . Tonsillectomy    . Thyroid surgery      Prior to Admission medications   Medication Sig Start Date End Date Taking? Authorizing Provider  alendronate (FOSAMAX) 70 MG tablet Take 70 mg by mouth once a week. Take with a full glass of water on an empty stomach.   Yes Historical Provider, MD  amLODipine (NORVASC) 10 MG tablet TAKE 1 TABLET BY MOUTH ONCE A DAY. 07/24/14  Yes Arnoldo Lenis, MD  aspirin 81 MG chewable tablet Chew 81 mg by mouth daily.     Yes Historical Provider, MD  atorvastatin (LIPITOR) 20 MG tablet TAKE (1) TABLET BY MOUTH AT BEDTIME. 05/30/14  Yes Arnoldo Lenis, MD  bumetanide (BUMEX) 0.5 MG tablet Take 1 tablet (0.5 mg total) by mouth daily as needed. Patient taking differently: Take 0.5 mg by mouth daily as needed (fluid).  01/16/14  Yes Arnoldo Lenis, MD  calcium carbonate (OS-CAL - DOSED IN MG OF ELEMENTAL CALCIUM)  1250 (500 CA) MG tablet Take 1 tablet by mouth daily.   Yes Historical Provider, MD  Cholecalciferol (VITAMIN D) 400 UNITS capsule Take 400 Units by mouth daily.     Yes Historical Provider, MD  Coenzyme Q10 (COQ10 PO) Take 1 tablet by mouth daily.   Yes Historical Provider, MD  Cyanocobalamin (VITAMIN B-12 PO) Take 1 tablet by mouth daily.   Yes Historical Provider, MD  Homeopathic Products ([employer] LEG CRAMPS PAIN RELIEF) TABS Take 2 tablets by mouth 4 (four) times daily.    Yes Historical Provider, MD  Magnesium 250 MG TABS Take 1 tablet by mouth daily.   Yes Historical Provider, MD  Multiple Vitamins-Minerals (MULTIVITAMIN WITH MINERALS) tablet Take 1 tablet by mouth daily.     Yes Historical Provider, MD  omeprazole (PRILOSEC) 20 MG capsule Take 20 mg by mouth daily.     Yes Historical Provider, MD  potassium chloride SA (K-DUR,KLOR-CON) 20 MEQ tablet Take 1 tablet (20 mEq total) by mouth daily as needed. 01/16/14  Yes Arnoldo Lenis, MD  valsartan-hydrochlorothiazide (DIOVAN HCT) 320-25 MG per tablet Take 1 tablet by mouth daily. 09/26/13  Yes Arnoldo Lenis, MD    Allergies as of 09/19/2014 - Review Complete 09/11/2014  Allergen Reaction Noted  . Morphine Other (See Comments) 07/19/2009    No family history on file.  History   Social History  . Marital Status: Widowed  Spouse Name: N/A  . Number of Children: 1  . Years of Education: N/A   Occupational History  . retired    Social History Main Topics  . Smoking status: Never Smoker   . Smokeless tobacco: Never Used  . Alcohol Use: No  . Drug Use: No  . Sexual Activity: Not on file   Other Topics Concern  . Not on file   Social History Narrative    Review of Systems: See HPI, otherwise negative ROS  Physical Exam: BP 185/66 mmHg  Pulse 74  Temp(Src) 98.2 F (36.8 C) (Oral)  Resp 16  Ht 4\' 10"  (1.473 m)  Wt 105 lb (47.628 kg)  BMI 21.95 kg/m2  SpO2 98% General:   Elderly alert lady, pleasant and  cooperative in NAD Skin:  Intact without significant lesions or rashes. Eyes:  Sclera clear, no icterus.   Conjunctiva pink. Ears:  Normal auditory acuity. Nose:  No deformity, discharge,  or lesions. Mouth:  No deformity or lesions. Neck:  Supple; no masses or thyromegaly. No significant cervical adenopathy. Lungs:  Clear throughout to auscultation.   No wheezes, crackles, or rhonchi. No acute distress. Heart:  Regular rate and rhythm; no murmurs, clicks, rubs,  or gallops. Abdomen: Non-distended, normal bowel sounds.  Soft and nontender without appreciable mass or hepatosplenomegaly.  Pulses:  Normal pulses noted. Extremities:  Without clubbing or edema.  Impression:    81 year old lady with esophageal dysphagia. 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   Recommendations:  Elective EGD today with completion of examination and probable biopsy. Esophageal dilation not likely be offered or performed today.  The risks, benefits, limitations, alternatives and imponderables have been reviewed with the patient. Potential for esophageal dilation, biopsy, etc. have also been reviewed.  Questions have been answered. All parties agreeable.    Notice: This dictation was prepared with Dragon dictation along with smaller phrase technology. Any transcriptional errors that result from this process are unintentional and may not be corrected upon review.

## 2014-09-21 NOTE — Progress Notes (Signed)
[facility] hospitalist progress note. Chief complaint. Transfer note. History of present illness. This 81 year old female presented to [facility] following an elective endoscopy. CT scan and barium swallow suggested an esophageal neoplasm. During endoscopy of perforation was noted in the esophagus. Patient was requested to transfer to [facility] by thoracic surgery. Plastic surgery did not feel that the patient was a surgical candidate however. The patient is now arrived in transfer and I'm seeing her at bedside to ensure she remains clinically stable and there are orders transferred appropriately. Physical exam. Vital signs. Temperature 98.1, pulse 84, respiration 18, blood pressure 160/61. O2 sats 96%. General appearance. Frail elderly female who is alert and in no distress. Cardiac. Rate and rhythm regular. Lungs. Breath sounds clear. Abdomen. Soft with positive bowel sounds. Impression/plan. Problem #1. Esophageal stricture with perforation. Patient initiated on IV Zosyn. Nothing by mouth currently. Will need gastroenterology and thoracic surgery consult arranged in the morning. Problem #2. Hypertension. Hydralazine when necessary for hypertension. Problem #3. Coronary atherosclerosis. No complaints of central chest pain. The patient appears clinically stable post transfer. All orders appear to of transferred appropriately.

## 2014-09-22 DIAGNOSIS — K223 Perforation of esophagus: Secondary | ICD-10-CM

## 2014-09-22 DIAGNOSIS — R110 Nausea: Secondary | ICD-10-CM | POA: Insufficient documentation

## 2014-09-22 DIAGNOSIS — Z515 Encounter for palliative care: Secondary | ICD-10-CM | POA: Insufficient documentation

## 2014-09-22 DIAGNOSIS — D490 Neoplasm of unspecified behavior of digestive system: Secondary | ICD-10-CM

## 2014-09-22 LAB — MRSA PCR SCREENING: MRSA BY PCR: NEGATIVE

## 2014-09-22 MED ORDER — HALOPERIDOL LACTATE 5 MG/ML IJ SOLN
0.5000 mg | Freq: Four times a day (QID) | INTRAMUSCULAR | Status: DC | PRN
  Administered 2014-09-23: 1 mg via INTRAVENOUS
  Administered 2014-09-24: 0.5 mg via INTRAVENOUS
  Filled 2014-09-22 (×2): qty 1

## 2014-09-22 MED ORDER — CHLORHEXIDINE GLUCONATE 0.12 % MT SOLN
15.0000 mL | Freq: Two times a day (BID) | OROMUCOSAL | Status: DC
  Administered 2014-09-22 – 2014-09-27 (×10): 15 mL via OROMUCOSAL
  Filled 2014-09-22 (×10): qty 15

## 2014-09-22 MED ORDER — PROMETHAZINE HCL 25 MG/ML IJ SOLN: 12.5000 mg | Freq: Four times a day (QID) | INTRAMUSCULAR | Status: DC | PRN

## 2014-09-22 MED ORDER — CETYLPYRIDINIUM CHLORIDE 0.05 % MT LIQD
7.0000 mL | Freq: Two times a day (BID) | OROMUCOSAL | Status: DC
  Administered 2014-09-22 – 2014-09-27 (×9): 7 mL via OROMUCOSAL

## 2014-09-22 NOTE — Consult Note (Signed)
Consultation Note Date: 09/22/2014   Patient Name: Colleen Roberts  DOB: [birth year]  MRN: [id number]  Age / Sex: 81 y.o., female   PCP: Sinda Du, MD Referring Physician: Koleen Nimrod Acost*  Reason for Consultation: Establishing goals of care and Non pain symptom management  Palliative Care Assessment and Plan Summary of Established Goals of Care and Medical Treatment Preferences   Clinical Assessment/Narrative: Pt is a 82 yo female transferred from [facility] after planned EGD. During endoscopy a perforation was discovered as well as a mass suggestive of a neoplasm. Thoracic surgery did not feel that she was a surgical candidate. Bx results are pending but daughter and granddaughter at the bedside were told that it was likely cancerous. Per family, pt does know there are perforations and that she may have cancer. She replied to them " Good, then it will be quick". Family also states that although she has a large loving family, she has been mourning the loss of her spouse for a number of years and has verbalized when it is her time she will be ready. Discussed with pt and with family issues regarding feeding going forward. Pt at this moment is NPO for what is anticipated to be 5 days to give the stent time to seal off perforation. Discussed role of feeding tubes in people with decreased ability to swallow or the option of "comfort feeding". Discussed that feeding tubes are a surgical procedure, although well tolerated; does not eliminate the risk of aspiration. Also in pt's case with esophageal perforation, eating likely to lead to sepsis which would be a terminal event. Daughter, who is a Merchandiser, retail and granddaughter who is also a hospice nurse verbalized understanding from working with many pt's with aspiration. Pt herself knows that to eat is dangerous, and could lead to death. She does not want further disease work up or procedures. She is deferring to her daughter to answer  most of her medical questions but did say "No",when asked about a feeding tube, then asked her daughter, "what do you think?". Family verbalized that they would have been surprised if she had responded yes and supported her decision. They plan is to take her home with family support and hospice. Pt is verbalizing epigastric pain after stent placement and is currently taking fentanyl 12.5-25 mcg prn which is helping. She also is spitting up thick secretions and having assc nausea. Zofran was ineffective and she is reporting a headache after phenergan. Family asking if we can try haldol  Contacts/Participants in Discussion: Primary Decision Maker: Daughter Syniyah Bourne, in conjunction with pt HCPOA: yes  Daughter and granddaughter present for conversation. Pt has 3 sons in addition to [place]. One lives in [place], and one in [place] state, the other in [place]:  DNR  No PEG or permanent feeding tube placed. Would like to have comfort food after 5-day period to see if this can help seal off perforations  Home with hospice  Symptom Management:   Pain: IV fentanyl PRN effective in hospital setting for pain mgt. Pt is allergic to ms04. Could send home with oxycodone concentrate 20mg /ml for mgt of pain and/or dyspnea  Nausea: Cont phenergan and Zofran but will also add haldol iv 0.5mg -1mg  q6 prn nausea  Additional Recommendations (Limitations, Scope, Preferences):  n/a Psycho-social/Spiritual:   Support System: large family  Desire for further Chaplaincy support:no  Prognosis: < 6 months  Discharge Planning:  Home with Hospice  Chief Complaint/History of Present Illness: Pt is a 81 yo female with new onset of dysphagia who underwent EGD 09/21/14. Perforations noted during procedure as well as a mass obstructing th esophagus which is likely malignant. She was transferred to [facility] for gastroenterology work up desired. Pt had a stent placed.  BX results pending  Primary Diagnoses  Present on Admission:  . Coronary atherosclerosis . Essential hypertension  Palliative Review of Systems: Verbalizing epigastric pain, nausea.  I have reviewed the medical record, interviewed the patient and family, and examined the patient. The following aspects are pertinent.  Past Medical History  Diagnosis Date  . Cancer     skin  . Hypertension    History   Social History  . Marital Status: Widowed    Spouse Name: N/A  . Number of Children: 1  . Years of Education: N/A   Occupational History  . retired    Social History Main Topics  . Smoking status: Never Smoker   . Smokeless tobacco: Never Used  . Alcohol Use: No  . Drug Use: No  . Sexual Activity: Not on file   Other Topics Concern  . None   Social History Narrative   History reviewed. No pertinent family history. Scheduled Meds: . antiseptic oral rinse  7 mL Mouth Rinse q12n4p  . chlorhexidine  15 mL Mouth Rinse BID  . piperacillin-tazobactam (ZOSYN)  IV  3.375 g Intravenous 3 times per day   Continuous Infusions: . sodium chloride 100 mL/hr (09/22/14 0617)   PRN Meds:.fentaNYL (SUBLIMAZE) injection, hydrALAZINE, ondansetron **OR** ondansetron (ZOFRAN) IV, promethazine Medications Prior to Admission:  Prior to Admission medications   Medication Sig Start Date End Date Taking? Authorizing Provider  alendronate (FOSAMAX) 70 MG tablet Take 70 mg by mouth once a week. Take with a full glass of water on an empty stomach.   Yes Historical Provider, MD  amLODipine (NORVASC) 10 MG tablet TAKE 1 TABLET BY MOUTH ONCE A DAY. 07/24/14  Yes Arnoldo Lenis, MD  aspirin 81 MG chewable tablet Chew 81 mg by mouth daily.     Yes Historical Provider, MD  atorvastatin (LIPITOR) 20 MG tablet TAKE (1) TABLET BY MOUTH AT BEDTIME. 05/30/14  Yes Arnoldo Lenis, MD  bumetanide (BUMEX) 0.5 MG tablet Take 1 tablet (0.5 mg total) by mouth daily as needed. Patient taking differently:  Take 0.5 mg by mouth daily as needed (fluid).  01/16/14  Yes Arnoldo Lenis, MD  calcium carbonate (OS-CAL - DOSED IN MG OF ELEMENTAL CALCIUM) 1250 (500 CA) MG tablet Take 1 tablet by mouth daily.   Yes Historical Provider, MD  Cholecalciferol (VITAMIN D) 400 UNITS capsule Take 400 Units by mouth daily.     Yes Historical Provider, MD  Coenzyme Q10 (COQ10 PO) Take 1 tablet by mouth daily.   Yes Historical Provider, MD  Cyanocobalamin (VITAMIN B-12 PO) Take 1 tablet by mouth daily.   Yes Historical Provider, MD  Homeopathic Products ([employer] LEG CRAMPS PAIN RELIEF) TABS Take 2 tablets by mouth 4 (four) times daily.    Yes Historical Provider, MD  Magnesium 250 MG TABS Take 1 tablet by mouth daily.   Yes Historical Provider, MD  Multiple Vitamins-Minerals (MULTIVITAMIN WITH MINERALS) tablet Take 1 tablet by mouth daily.     Yes Historical Provider, MD  omeprazole (PRILOSEC) 20 MG capsule Take 20 mg by mouth daily.     Yes Historical Provider, MD  potassium chloride SA (K-DUR,KLOR-CON) 20 MEQ tablet Take 1 tablet (20  mEq total) by mouth daily as needed. 01/16/14  Yes Arnoldo Lenis, MD  valsartan-hydrochlorothiazide (DIOVAN HCT) 320-25 MG per tablet Take 1 tablet by mouth daily. 09/26/13  Yes Arnoldo Lenis, MD   Allergies  Allergen Reactions  . Morphine Other (See Comments)    Nearly died per patient   CBC:    Component Value Date/Time   WBC 7.0 09/11/2014 1145   HGB 12.7 09/11/2014 1145   HCT 37.4 09/11/2014 1145   PLT 194 09/11/2014 1145   MCV 85.6 09/11/2014 1145   NEUTROABS 4.2 09/11/2014 1145   LYMPHSABS 2.1 09/11/2014 1145   MONOABS 0.7 09/11/2014 1145   EOSABS 0.1 09/11/2014 1145   BASOSABS 0.0 09/11/2014 1145   Comprehensive Metabolic Panel:    Component Value Date/Time   NA 134* 09/11/2014 1145   K 3.2* 09/11/2014 1145   CL 97* 09/11/2014 1145   CO2 28 09/11/2014 1145   BUN 22* 09/11/2014 1145   CREATININE 0.78 09/11/2014 1145   CREATININE 0.75 05/09/2014 0829    GLUCOSE 140* 09/11/2014 1145   CALCIUM 9.1 09/11/2014 1145   AST 15 09/11/2014 1145   ALT 14 09/11/2014 1145   ALKPHOS 68 09/11/2014 1145   BILITOT 0.7 09/11/2014 1145   PROT 7.1 09/11/2014 1145   ALBUMIN 3.6 09/11/2014 1145    Physical Exam: Vital Signs: BP 158/53 mmHg  Pulse 87  Temp(Src) 98.6 F (37 C) (Oral)  Resp 16  Ht 4\' 10"  (1.473 m)  Wt 47.718 kg (105 lb 3.2 oz)  BMI 21.99 kg/m2  SpO2 95% SpO2: SpO2: 95 % O2 Device: O2 Device: Not Delivered O2 Flow Rate: O2 Flow Rate (L/min): 2 L/min Intake/output summary:  Intake/Output Summary (Last 24 hours) at 09/22/14 1607 Last data filed at 09/22/14 0900  Gross per 24 hour  Intake   1300 ml  Output   1225 ml  Net     75 ml   LBM: Last BM Date: 09/22/14 Baseline Weight: Weight: 47.628 kg (105 lb) Most recent weight: Weight: 47.718 kg (105 lb 3.2 oz)  Exam Findings:  General; Elderly female. She is alert but very weak and clearly feels badly. Holding an emesis basin Resp: No work of breathing observed Psych: A/O to person, place, and situation         Palliative Performance Scale: 50-60%              Additional Data Reviewed: No results for input(s): WBC, HGB, PLT, NA, BUN, CREATININE, ALB in the last 72 hours.   Time In: 1500 Time Out: 1620  Time Total: 80 min Greater than 50%  of this time was spent counseling and coordinating care related to the above assessment and plan. Discussed goals and usage of haldol for nausea with Dr. Jerilee Hoh  Signed by: Dory Horn, NP  Dory Horn, NP  09/22/2014, 4:07 PM  Please contact Palliative Medicine Team phone at [phone] for questions and concerns.

## 2014-09-22 NOTE — Progress Notes (Signed)
Initial Nutrition Assessment  DOCUMENTATION CODES:   Not applicable  INTERVENTION:    PO diet advancement vs nutrition support initiation   If TF started, recommend Jevity 1.2 formula -- initiate at 20 ml/hr and increase by 10 ml every 8 hours to goal rate of 40 ml/hr to provide 1152 kcals, 53 gm protein, 775 ml of free water  NUTRITION DIAGNOSIS:   Inadequate oral intake related to inability to eat as evidenced by NPO status  GOAL:   Patient will meet greater than or equal to 90% of their needs  MONITOR:   Diet advancement, PO intake, Labs, Weight trends, I & O's  REASON FOR ASSESSMENT:   Malnutrition Screening Tool  ASSESSMENT:  81 y.o. Female who is post esophageal stent in the endoscopy suite. 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 During the endoscopy,a perforation was noted in the esophagus. Dr. Gala Romney has spoken with thoracic surgery and it is felt that the patient would be better served transferring to [facility] due to lack of gastroenterology services this weekend. Thoracic surgery did not feel that she was a surgical candidate.  Patient s/p procedures 7/15: EGD WITH ESOPHAGEAL MASS BIOPSY ESOPHAGEAL WALL STENT PLACEMENT  Patient transferred from [facility].  RD spoke with patient's daughter and grand-daughter.  Report pt was consuming mostly liquids for approximately 1 week PTA (ie Ensure, pureed soups).  Also state pt has had a 10 lb weight loss in the past month.  Per wt readings, pt's weight down 4 lbs.  RD questions whether nutrition support will be warranted.  RD unable to complete Nutrition Focused Physical Exam at this time; suspect possible malnutrition.  Diet Order:  NPO  Skin:  Reviewed, no issues  Last BM:  Unknown  Height:   Ht Readings from Last 1 Encounters:  09/21/14 4\' 10"  (1.473 m)    Weight:    Wt Readings from Last 1 Encounters:  09/21/14 105 lb 3.2 oz (47.718 kg)    Ideal Body Weight:  94 kg  Wt Readings from Last 10 Encounters:  09/21/14 105 lb 3.2 oz (47.718 kg)  09/11/14 105 lb (47.628 kg)  05/14/14 109 lb 3.2 oz (49.533 kg)  01/16/14 110 lb (49.896 kg)  01/12/13 117 lb (53.071 kg)  10/27/11 117 lb (53.071 kg)  05/29/11 118 lb (53.524 kg)  02/18/11 117 lb (53.071 kg)  08/12/10 115 lb (52.164 kg)  08/06/10 115 lb (52.164 kg)    BMI:  Body mass index is 21.99 kg/(m^2).  Estimated Nutritional Needs:   Kcal:  1100-1300  Protein:  50-60 gm  Fluid:  >/= 1.5 L  EDUCATION NEEDS:   No education needs identified at this time  Arthur Holms, RD, LDN Pager #: [phone] After-Hours Pager #: [phone]

## 2014-09-22 NOTE — Progress Notes (Signed)
[facility] PROGRESS NOTE  Colleen Roberts FDB:[id number] DOB: 12/10/1918 DOA: 09/21/2014 PCP: Fredirick Maudlin, MD  Assessment/Plan: Esophageal perforation with esophageal neoplasm -Patient initially presented to Dr. Jena Gauss, GI physician at [facility], at the beginning of July with a food impaction, underwent EGD on July 5. Because of findings seen on the EGD a CT scan was recommended which showed findings suspicious for neoplasm. On 7/15 repeat EGD was done for biopsy purposes and at that time an esophageal perforation was noted. Per notes in the chart, it appears Dr. Jena Gauss discussed case with cardiothoracic surgery Dr. Cornelius Moras, and it was determined that given patient's advanced age and esophageal neoplasm she would not be a surgical candidate. It was decided to place a Wallstent. Decision was made to transfer her to [facility] given lack of GI coverage, over the weekend, and surgical services to address any problems that could arise in her aftercare. -Detailed discussion with patient and multiple family members at bedside today. They are upset that CT surgery and GI have not met with them today (as they were told to expect prior to transfer). -Discussed with CT surgery PA, Erin, and with GI on call, Dr. Loreta Ave. They will see patient at some point during the weekend. -Poor prognosis discussed with daughter, son and 2 granddaughters at bedside. We have agreed to pursue palliative care consultation. -They are currently struggling with what the correct answer will be in regards to her feeding. They are not clear whether to place a feeding tube (which will not completely eliminate the risk for mediastinitis) versus TNA versus allowing comfort feeds. It is important to bear in mind that despite patient's advanced age of 81 years, she is very active at baseline, lives independently, performs all of her ADLs and is of sound mind. -We'll request palliative care consultation for further family  discussions.  Code Status: DNR Family Communication: multiple family members at bedside  Disposition Pl[place] be determined   Consultants:  GI  Vascular surgery  Palliative care   Antibiotics:  Zosyn   Subjective: Complains of mild CP, fully understands current condition.  Objective: Filed Vitals:   09/22/14 0445 09/22/14 0555 09/22/14 0755 09/22/14 1118  BP: 153/58 163/47 158/53   Pulse: 87 87    Temp:   99 F (37.2 C) 98.6 F (37 C)  TempSrc:   Oral Oral  Resp:      Height:      Weight:      SpO2: 93% 95%      Intake/Output Summary (Last 24 hours) at 09/22/14 1555 Last data filed at 09/22/14 0900  Gross per 24 hour  Intake   1300 ml  Output   1225 ml  Net     75 ml   Filed Weights   09/21/14 1421 09/21/14 2152  Weight: 47.628 kg (105 lb) 47.718 kg (105 lb 3.2 oz)    Exam:   General:  AA Ox3  Cardiovascular: RRR  Respiratory: CTA B  Abdomen: S/NT/ND/{BS  Extremities: no C/C/E   Neurologic:  Non-focal  Data Reviewed: Basic Metabolic Panel: No results for input(s): NA, K, CL, CO2, GLUCOSE, BUN, CREATININE, CALCIUM, MG, PHOS in the last 168 hours. Liver Function Tests: No results for input(s): AST, ALT, ALKPHOS, BILITOT, PROT, ALBUMIN in the last 168 hours. No results for input(s): LIPASE, AMYLASE in the last 168 hours. No results for input(s): AMMONIA in the last 168 hours. CBC: No results for input(s): WBC, NEUTROABS, HGB, HCT, MCV, PLT in  the last 168 hours. Cardiac Enzymes: No results for input(s): CKTOTAL, CKMB, CKMBINDEX, TROPONINI in the last 168 hours. BNP (last 3 results) No results for input(s): BNP in the last 8760 hours.  ProBNP (last 3 results) No results for input(s): PROBNP in the last 8760 hours.  CBG: No results for input(s): GLUCAP in the last 168 hours.  Recent Results (from the past 240 hour(s))  MRSA PCR Screening     Status: None   Collection Time: 09/21/14  9:55 PM  Result Value Ref Range Status   MRSA by PCR  NEGATIVE NEGATIVE Final    Comment:        The GeneXpert MRSA Assay (FDA approved for NASAL specimens only), is one component of a comprehensive MRSA colonization surveillance program. It is not intended to diagnose MRSA infection nor to guide or monitor treatment for MRSA infections.      Studies: Dg Fluoro Rm 1-60 Min  09/21/2014   CLINICAL DATA:  Esophageal stent placement post esophageal perforation during EGD, fluoro time 59min 32 sec  EXAM: FLUORO RM 1-60 MIN  CONTRAST:  None applicable  FLUOROSCOPY TIME:  Fluoroscopy Time (in minutes and seconds): 3 minutes 32 seconds  Number of Acquired Images:  4  COMPARISON:  Chest CT 09/18/2014  FINDINGS: Images submitted show endoscope in place. 3 images demonstrate upper and lower portions of esophageal stent. Paper clips overlie the thorax, presumably used for reference.  IMPRESSION: Fluoroscopy utilized for esophageal stent placement.   Electronically Signed   By: Nolon Nations M.D.   On: 09/21/2014 19:39   Dg Chest Port 1 View  09/21/2014   CLINICAL DATA:  81 year old female status post esophageal stent placement. Concern for esophageal perforation during EGD.  EXAM: PORTABLE CHEST - 1 VIEW  COMPARISON:  Chest CT dated 09/18/2014  FINDINGS: Single-view of the chest demonstrate clear lungs. No pleural effusion or pneumothorax. The lung apices are not evaluated due to superimposition of the patient's mandible. Top-normal cardiac size. Median sternotomy wires and CABG surgical clips noted. An esophageal stent is noted extending from the level of the aortic arch to the distal esophagus. No definite pneumomediastinum identified, however evaluation of the status is limited on radiograph.  IMPRESSION: Esophageal stent.  No definite pneumomediastinum.   Electronically Signed   By: Anner Crete M.D.   On: 09/21/2014 18:40    Scheduled Meds: . antiseptic oral rinse  7 mL Mouth Rinse q12n4p  . chlorhexidine  15 mL Mouth Rinse BID  .  piperacillin-tazobactam (ZOSYN)  IV  3.375 g Intravenous 3 times per day   Continuous Infusions: . sodium chloride 100 mL/hr (09/22/14 0617)    Active Problems:   Essential hypertension   Coronary atherosclerosis   Esophageal stricture    Time spent: 50 minutes. Greater than 50% of this time was spent in direct contact with the patient coordinating care.    Lelon Frohlich  [facility] Pager [phone]  If 7PM-7AM, please contact night-coverage at [email or web address], password [facility] 09/22/2014, 3:55 PM  LOS: 1 day

## 2014-09-23 DIAGNOSIS — K223 Perforation of esophagus: Secondary | ICD-10-CM

## 2014-09-23 NOTE — Progress Notes (Signed)
Utilization Review Completed.Colleen Roberts T7/17/2016  

## 2014-09-23 NOTE — Consult Note (Addendum)
[address]       [place]leen [address]             [phone]          CARDIOTHORACIC SURGERY CONSULTATION REPORT  PCP is HAWKINS,EDWARD L, MD Referring Provider is Koleen Nimrod, MD   Reason for consultation:  Perforated esophageal cancer  HPI:  Patient is a 81 year old female who recently presented with long-standing dysphagia ultimately complicated by food impaction.  She was found to have a Zenker's diverticulum with a large amount of food and old debris in the esophagus. Follow-up barium esophagram revealed narrowing in the mid esophagus and a very small Zenker's diverticulum. Chest CT scan revealed a mass in the mid esophagus suspicious for malignant neoplasm.  She underwent repeat EGD with biopsy of a mass in the midesophagus by Dr. Gala Romney on 09/21/2014.  This procedure was complicated by perforation of the esophagus just above the mass. Dr. Gala Romney contacted both myself and Dr. Servando Snare via telephone and explained the circumstances. A decision was made to proceed with placement of a covered stent for palliation of the area of perforation with the understanding that the patient would not be considered candidate for surgical intervention under any circumstances. The patient tolerated the procedure reportedly well but was subsequently transferred to [facility] for further management. I was personally notified that the patient was hospitalized in [place] this morning by Ellwood Handler and given the message that cardiothoracic surgical consultation was requested. Since transfer to [facility] the patient has not been seen in consultation by the gastroenterology team but has been evaluated by palliative care.  The patient reports feeling well with mild intermittent discomfort in her mid chest. She admits to a long history of gradually worsening symptoms of dysphagia without odynophagia. She denies any fevers chills or productive cough. She reports no  shortness of breath. She has no pleuritic chest pain or abdominal pain.  Past Medical History  Diagnosis Date  . Cancer     skin  . Hypertension     Past Surgical History  Procedure Laterality Date  . Cholecystectomy    . Coronary artery bypass graft    . Eye surgery Bilateral   . Tonsillectomy    . Thyroid surgery      History reviewed. No pertinent family history.  History   Social History  . Marital Status: Widowed    Spouse Name: N/A  . Number of Children: 1  . Years of Education: N/A   Occupational History  . retired    Social History Main Topics  . Smoking status: Never Smoker   . Smokeless tobacco: Never Used  . Alcohol Use: No  . Drug Use: No  . Sexual Activity: Not on file   Other Topics Concern  . Not on file   Social History Narrative    Prior to Admission medications   Medication Sig Start Date End Date Taking? Authorizing Provider  alendronate (FOSAMAX) 70 MG tablet Take 70 mg by mouth once a week. Take with a full glass of water on an empty stomach.   Yes Historical Provider, MD  amLODipine (NORVASC) 10 MG tablet TAKE 1 TABLET BY MOUTH ONCE A DAY. 07/24/14  Yes Arnoldo Lenis, MD  aspirin 81 MG chewable tablet Chew 81 mg by mouth daily.     Yes Historical Provider, MD  atorvastatin (LIPITOR) 20 MG tablet TAKE (1) TABLET BY MOUTH AT BEDTIME. 05/30/14  Yes Roderic Palau  Dale Gateway, MD  bumetanide (BUMEX) 0.5 MG tablet Take 1 tablet (0.5 mg total) by mouth daily as needed. Patient taking differently: Take 0.5 mg by mouth daily as needed (fluid).  01/16/14  Yes Arnoldo Lenis, MD  calcium carbonate (OS-CAL - DOSED IN MG OF ELEMENTAL CALCIUM) 1250 (500 CA) MG tablet Take 1 tablet by mouth daily.   Yes Historical Provider, MD  Cholecalciferol (VITAMIN D) 400 UNITS capsule Take 400 Units by mouth daily.     Yes Historical Provider, MD  Coenzyme Q10 (COQ10 PO) Take 1 tablet by mouth daily.   Yes Historical Provider, MD  Cyanocobalamin (VITAMIN B-12 PO) Take 1  tablet by mouth daily.   Yes Historical Provider, MD  Homeopathic Products ([employer] LEG CRAMPS PAIN RELIEF) TABS Take 2 tablets by mouth 4 (four) times daily.    Yes Historical Provider, MD  Magnesium 250 MG TABS Take 1 tablet by mouth daily.   Yes Historical Provider, MD  Multiple Vitamins-Minerals (MULTIVITAMIN WITH MINERALS) tablet Take 1 tablet by mouth daily.     Yes Historical Provider, MD  omeprazole (PRILOSEC) 20 MG capsule Take 20 mg by mouth daily.     Yes Historical Provider, MD  potassium chloride SA (K-DUR,KLOR-CON) 20 MEQ tablet Take 1 tablet (20 mEq total) by mouth daily as needed. 01/16/14  Yes Arnoldo Lenis, MD  valsartan-hydrochlorothiazide (DIOVAN HCT) 320-25 MG per tablet Take 1 tablet by mouth daily. 09/26/13  Yes Arnoldo Lenis, MD    Current Facility-Administered Medications  Medication Dose Route Frequency Provider Last Rate Last Dose  . 0.9 %  sodium chloride infusion   Intravenous Continuous Doree Albee, MD 100 mL/hr at 09/22/14 1701    . antiseptic oral rinse (CPC / CETYLPYRIDINIUM CHLORIDE 0.05%) solution 7 mL  7 mL Mouth Rinse q12n4p Nimish C Gosrani, MD   7 mL at 09/22/14 1600  . chlorhexidine (PERIDEX) 0.12 % solution 15 mL  15 mL Mouth Rinse BID Doree Albee, MD   15 mL at 09/23/14 0623  . fentaNYL (SUBLIMAZE) injection 12.5-25 mcg  12.5-25 mcg Intravenous Q2H PRN Dianne Dun, NP   25 mcg at 09/23/14 7628  . haloperidol lactate (HALDOL) injection 0.5-1 mg  0.5-1 mg Intravenous Q6H PRN Dory Horn, NP   1 mg at 09/23/14 1116  . hydrALAZINE (APRESOLINE) injection 5 mg  5 mg Intravenous Q4H PRN Nimish C Anastasio Champion, MD      . ondansetron (ZOFRAN) tablet 4 mg  4 mg Oral Q6H PRN Nimish Luther Parody, MD       Or  . ondansetron (ZOFRAN) injection 4 mg  4 mg Intravenous Q6H PRN Nimish Luther Parody, MD   4 mg at 09/22/14 1446  . piperacillin-tazobactam (ZOSYN) IVPB 3.375 g  3.375 g Intravenous 3 times per day Franky Macho, RPH   3.375 g at 09/23/14  0519  . promethazine (PHENERGAN) injection 12.5 mg  12.5 mg Intravenous Q6H PRN Erline Hau, MD        Allergies  Allergen Reactions  . Morphine Other (See Comments)    Nearly died per patient      Review of Systems:  As noted per HPI.  Remainder noncontributory     Physical Exam:   BP 149/55 mmHg  Pulse 86  Temp(Src) 97.4 F (36.3 C) (Oral)  Resp 16  Ht 4\' 10"  (1.473 m)  Wt 48.036 kg (105 lb 14.4 oz)  BMI 22.14 kg/m2  SpO2 93%  General:  Elderly but o/w well-appearing  HEENT:  Unremarkable   Neck:   no JVD, no bruits, no adenopathy   Chest:   clear to auscultation, symmetrical breath sounds, no wheezes, no rhonchi   CV:   RRR, no  murmur   Abdomen:  soft, non-tender, no masses   Extremities:  warm, well-perfused, trace lower extremity edema  Rectal/GU  Deferred  Neuro:   Grossly non-focal and symmetrical throughout  Skin:   Clean and dry, no rashes, no breakdown  Diagnostic Tests:  CT CHEST WITH CONTRAST  TECHNIQUE: Multidetector CT imaging of the chest was performed during intravenous contrast administration.  CONTRAST: 49mL OMNIPAQUE IOHEXOL 300 MG/ML SOLN  COMPARISON: Esophagram 09/14/2014, CT thorax 03/23/2013  FINDINGS: Mediastinum/Nodes: No axillary or supraclavicular lymphadenopathy. No mediastinal hilar lymphadenopathy. No pericardial fluid.  There is circumferential thickening of the mid esophagus at the level the carina. This thickening is asymmetric with the greater masslike thickening along the posterior wall of the esophagus. This thickening occurs over a 32 mm segment and measures 17 mm in thickness on image 48, series 6. This is suggestive of a mucosal lesion within the esophagus.  No paraesophageal lymph nodes identified.  Lungs/Pleura: There are several scattered small pulmonary nodules within left and right lungs are less than 5 mm and not changed from comparison exam. Example nodule would include 5 mm left  upper lobe nodule on image 26, series 4 unchanged.  Upper abdomen: Limited view of the liver demonstrates a low-density lesion in the right hepatic lobe measuring 10 mm on image 54; this lesions unchanged from comparison CT of 05/02/2013. Mild thickening of the left adrenal gland similar to prior. No upper abdominal lymphadenopathy.  Musculoskeletal: Degenerate changes of the spine without aggressive osseous lesion.  IMPRESSION: 1. A 3 cm mucosal / submucosal lesion in the mid esophagus is most consistent with primary esophageal neoplasm. 2. No evidence of metastatic disease. 3. Scattered sub cm pulmonary nodules the lungs are not changed from prior likely benign granuloma. 4. Low-density lesion in the right hepatic lobe likely represents benign cyst or hemangioma. These results will be called to the ordering clinician or representative by the Radiologist Assistant, and communication documented in the PACS or zVision Dashboard.   Electronically Signed  By: Suzy Bouchard M.D.  On: 09/18/2014 08:54   PORTABLE CHEST - 1 VIEW  COMPARISON: Chest CT dated 09/18/2014  FINDINGS: Single-view of the chest demonstrate clear lungs. No pleural effusion or pneumothorax. The lung apices are not evaluated due to superimposition of the patient's mandible. Top-normal cardiac size. Median sternotomy wires and CABG surgical clips noted. An esophageal stent is noted extending from the level of the aortic arch to the distal esophagus. No definite pneumomediastinum identified, however evaluation of the status is limited on radiograph.  IMPRESSION: Esophageal stent. No definite pneumomediastinum.   Electronically Signed  By: Anner Crete M.D.  On: 09/21/2014 18:40    Impression:  The patient is an elderly female with recently discovered partially obstructing mass in the midesophagus consistent with esophageal cancer, although histology from recent biopsy remains  pending at this time. The patient presented initially with food impaction and subsequently developed iatrogenic perforation of the esophagus immediately proximal to the mass during repeat EGD with biopsy performed 2 days ago. Because of the patient's extremely advanced age and underlying malignancy, she would not be considered a candidate for surgical esophagectomy under any circumstances.  Palliative options are limited, but placement of a covered stent seems very reasonable, particularly since the patient  did not have perforation or mediastinitis prior to her EGD.    Plan:  According to the patient's family, Dr. Roseanne Kaufman original plan was to keep the patient NPO for 5 days and obtain a Gastrografin upper GI contrast study at that time. If the stent position remains in good position and there was no sign of leak on contrast study the patient could be started on a liquid diet.  This seems like a very reasonable plan.  Short-term broad-spectrum antibiosis could be considered to prevent the development of mediastinitis.  I would never consider allowing this patient to have a solid diet in the future under any circumstances.    Given that the patient has been transferred from [facility] to [facility], I would recommend consultation with the local gastroenterology team to assist with management of the patient's diet and stent.  A follow up CXR should be performed and Gastrograffin contrast study scheduled for later this week.  A decision will need to be made what to do if follow-up Gastrografin contrast study does not look good.  It is possible that the stent could migrate and need to be repositioned.  Pathology results from the patient's biopsies need to be obtained.  I agree with the assessment and plan made by the palliative care team, as the patient's long-term prognosis remains poor and she should probably not be considered a candidate for surgical intervention under any circumstances. Whether or not Dr. Roseanne Kaufman  plan could be carried out as an outpatient should be discussed with Dr. Gala Romney and other members of the GI medicine team.  Under the circumstances it might make more sense to keep the patient in the hospital until follow-up upper GI contrast study has been performed and the patient can be started on a liquid diet.  I spent in excess of 60 minutes during the conduct of this hospital encounter and >50% of this time involved direct face-to-face encounter with the patient for counseling and/or coordination of their care.   Valentina Gu. Roxy Manns, MD 09/23/2014 1:56 PM

## 2014-09-23 NOTE — Progress Notes (Signed)
[facility] PROGRESS NOTE  Colleen Roberts IEP:[id number] DOB: 18-Mar-1918 DOA: 09/21/2014 PCP: Alonza Bogus, MD  Assessment/Plan: Esophageal Perforation Esophageal Neoplasm  Patient and family have elected comfort care. Patient does not want a feeding tube. Plan to keep NPO for 5 days post-stent placement to allow it to seal and then allow comfort feeds. Patient will likely expire from dehydration/malnutrition vs sepsis from mediastinitis. Discussed with daughter at bedside. They have elected home with hospice and will likely DC in am. Will ask CM to arrange Home Hospice.  Code Status: DNR Family Communication: Daughter Terri at bedside  Disposition Plan: Home with hospice in 24 hours.   Consultants:  GI  Palliative care   Antibiotics:  Zosyn   Subjective: Sleeping in recliner. Daughter asks that I do not disturb her as she had a restless night. Daughter states she had some CP and nausea earlier today.  Objective: Filed Vitals:   09/22/14 2000 09/23/14 0000 09/23/14 0400 09/23/14 0838  BP: 147/61 121/46 145/55 150/50  Pulse: 83 89 87 74  Temp: 99.7 F (37.6 C) 98.8 F (37.1 C) 98.8 F (37.1 C) 98.6 F (37 C)  TempSrc: Oral Oral Oral Oral  Resp: 13 12 16    Height:      Weight:   48.036 kg (105 lb 14.4 oz)   SpO2: 94% 96% 95% 95%    Intake/Output Summary (Last 24 hours) at 09/23/14 1025 Last data filed at 09/23/14 6606  Gross per 24 hour  Intake   1000 ml  Output    425 ml  Net    575 ml   Filed Weights   09/21/14 1421 09/21/14 2152 09/23/14 0400  Weight: 47.628 kg (105 lb) 47.718 kg (105 lb 3.2 oz) 48.036 kg (105 lb 14.4 oz)    Exam:   General:  Sleeoin  Cardiovascular: Deferred  Respiratory: Deferred  Abdomen: Deferred  Extremities: Deferred   Neurologic:  Deferred  Data Reviewed: Basic Metabolic Panel: No results for input(s): NA, K, CL, CO2, GLUCOSE, BUN, CREATININE, CALCIUM, MG, PHOS in the last 168 hours. Liver Function  Tests: No results for input(s): AST, ALT, ALKPHOS, BILITOT, PROT, ALBUMIN in the last 168 hours. No results for input(s): LIPASE, AMYLASE in the last 168 hours. No results for input(s): AMMONIA in the last 168 hours. CBC: No results for input(s): WBC, NEUTROABS, HGB, HCT, MCV, PLT in the last 168 hours. Cardiac Enzymes: No results for input(s): CKTOTAL, CKMB, CKMBINDEX, TROPONINI in the last 168 hours. BNP (last 3 results) No results for input(s): BNP in the last 8760 hours.  ProBNP (last 3 results) No results for input(s): PROBNP in the last 8760 hours.  CBG: No results for input(s): GLUCAP in the last 168 hours.  Recent Results (from the past 240 hour(s))  MRSA PCR Screening     Status: None   Collection Time: 09/21/14  9:55 PM  Result Value Ref Range Status   MRSA by PCR NEGATIVE NEGATIVE Final    Comment:        The GeneXpert MRSA Assay (FDA approved for NASAL specimens only), is one component of a comprehensive MRSA colonization surveillance program. It is not intended to diagnose MRSA infection nor to guide or monitor treatment for MRSA infections.      Studies: Dg Fluoro Rm 1-60 Min  09/21/2014   CLINICAL DATA:  Esophageal stent placement post esophageal perforation during EGD, fluoro time 89min 32 sec  EXAM: FLUORO RM 1-60 MIN  CONTRAST:  None applicable  FLUOROSCOPY TIME:  Fluoroscopy Time (in minutes and seconds): 3 minutes 32 seconds  Number of Acquired Images:  4  COMPARISON:  Chest CT 09/18/2014  FINDINGS: Images submitted show endoscope in place. 3 images demonstrate upper and lower portions of esophageal stent. Paper clips overlie the thorax, presumably used for reference.  IMPRESSION: Fluoroscopy utilized for esophageal stent placement.   Electronically Signed   By: Nolon Nations M.D.   On: 09/21/2014 19:39   Dg Chest Port 1 View  09/21/2014   CLINICAL DATA:  81 year old female status post esophageal stent placement. Concern for esophageal perforation during  EGD.  EXAM: PORTABLE CHEST - 1 VIEW  COMPARISON:  Chest CT dated 09/18/2014  FINDINGS: Single-view of the chest demonstrate clear lungs. No pleural effusion or pneumothorax. The lung apices are not evaluated due to superimposition of the patient's mandible. Top-normal cardiac size. Median sternotomy wires and CABG surgical clips noted. An esophageal stent is noted extending from the level of the aortic arch to the distal esophagus. No definite pneumomediastinum identified, however evaluation of the status is limited on radiograph.  IMPRESSION: Esophageal stent.  No definite pneumomediastinum.   Electronically Signed   By: Anner Crete M.D.   On: 09/21/2014 18:40    Scheduled Meds: . antiseptic oral rinse  7 mL Mouth Rinse q12n4p  . chlorhexidine  15 mL Mouth Rinse BID  . piperacillin-tazobactam (ZOSYN)  IV  3.375 g Intravenous 3 times per day   Continuous Infusions: . sodium chloride 100 mL/hr at 09/22/14 1701    Active Problems:   Essential hypertension   Coronary atherosclerosis   Esophageal stricture   Esophageal neoplasm   Esophageal perforation   Palliative care encounter   Nausea without vomiting    Time spent: 15 minutes. Mainly spent discussing with daughter at bedside.   Lelon Frohlich  [facility] Pager [phone]  If 7PM-7AM, please contact night-coverage at [email or web address], password [facility] 09/23/2014, 10:25 AM  LOS: 2 days

## 2014-09-24 ENCOUNTER — Encounter (HOSPITAL_COMMUNITY): Payer: Self-pay | Admitting: Internal Medicine

## 2014-09-24 ENCOUNTER — Inpatient Hospital Stay (HOSPITAL_COMMUNITY): Payer: Medicare Other

## 2014-09-24 NOTE — Care Management Note (Addendum)
Case Management Note  Patient Details  Name: Colleen Roberts MRN: [id number] Date of Birth: [birth year]  Subjective/Objective:      post esophageal stent              Action/Plan: Home Hospice  Expected Discharge Date:  09/28/14               Expected Discharge Plan:  Home w Hospice Care  In-House Referral:     Discharge planning Services  CM Consult  Post Acute Care Choice:  Hospice Choice offered to:  Adult Children  DME Arranged:  3-N-1 DME Agency:  [facility]  HH Arranged:  RN [place] Agency:  [facility]  Status of Service:  Completed, signed off  Medicare Important Message Given:    Date Medicare IM Given:    Medicare IM give by:    Date Additional Medicare IM Given:    Additional Medicare Important Message give by:     If discussed at [place] of Stay Meetings, dates discussed:    Additional Comments: 09/24/2014 1200 Received call back and referral received. Will arrange Hospice admission post dc. Will fax dc summary at dc to [facility] RN CCM Case Mgmt phone [phone]  Consult for Home Hospice. NCM spoke to pt and gave permission to speak to dtr, [name] # [phone]. Offered choice for Hospice. Dtr requesting [facility]. Requesting 3n1 bedside commode for home. Contacted Hospice with new referral.  Referral faxed to [facility].  Erenest Rasher, RN 09/24/2014, 10:45 AM

## 2014-09-24 NOTE — Progress Notes (Signed)
ANTIBIOTIC CONSULT NOTE - FOLLOW UP  Pharmacy Consult for zosyn Indication: esophageal perforation  Allergies  Allergen Reactions  . Morphine Other (See Comments)    Nearly died per patient    Patient Measurements: Height: 4\' 10"  (147.3 cm) Weight: 104 lb 4.8 oz (47.31 kg) IBW/kg (Calculated) : 40.9   Vital Signs: Temp: 98.3 F (36.8 C) (07/18 0757) Temp Source: Oral (07/18 0757) BP: 177/63 mmHg (07/18 0757) Pulse Rate: 89 (07/18 0757) Intake/Output from previous day: 07/17 0701 - 07/18 0700 In: 500 [I.V.:400; IV Piggyback:100] Out: 675 [Urine:675] Intake/Output from this shift:    Labs: No results for input(s): WBC, HGB, PLT, LABCREA, CREATININE in the last 72 hours. Estimated Creatinine Clearance: 28.4 mL/min (by C-G formula based on Cr of 0.78). No results for input(s): VANCOTROUGH, VANCOPEAK, VANCORANDOM, GENTTROUGH, GENTPEAK, GENTRANDOM, TOBRATROUGH, TOBRAPEAK, TOBRARND, AMIKACINPEAK, AMIKACINTROU, AMIKACIN in the last 72 hours.   Microbiology: Recent Results (from the past 720 hour(s))  MRSA PCR Screening     Status: None   Collection Time: 09/21/14  9:55 PM  Result Value Ref Range Status   MRSA by PCR NEGATIVE NEGATIVE Final    Comment:        The GeneXpert MRSA Assay (FDA approved for NASAL specimens only), is one component of a comprehensive MRSA colonization surveillance program. It is not intended to diagnose MRSA infection nor to guide or monitor treatment for MRSA infections.     Anti-infectives    Start     Dose/Rate Route Frequency Ordered Stop   09/22/14 0000  piperacillin-tazobactam (ZOSYN) IVPB 3.375 g     3.375 g 12.5 mL/hr over 240 Minutes Intravenous 3 times per day 09/21/14 2305     09/21/14 1545  piperacillin-tazobactam (ZOSYN) IVPB 3.375 g     3.375 g 100 mL/hr over 30 Minutes Intravenous  Once 09/21/14 1538 09/21/14 1555      Assessment: 81 y.o. female presented to [facility] with esophageal perforation s/p esophageal stent  placement. She continues on empiric Zosyn day 4 for esophageal perforation. Palliative care has seen and plans are for home with hospice.   Plan:  -No zosyn changes needed -will follow patient progress  Hildred Laser, Pharm D 09/24/2014 8:41 AM

## 2014-09-24 NOTE — Progress Notes (Signed)
[facility] PROGRESS NOTE  Colleen Roberts IOE:[id number] DOB: Jan 10, 1919 DOA: 09/21/2014 PCP: Alonza Bogus, MD  Assessment/Plan: Esophageal Perforation Esophageal Neoplasm HTN  Patient and family have elected comfort care and home hospice. Have discussed case with Dr. Gala Romney. Will plan to keep NPO for 5 days after stent placement and order a gastrograffin barium swallow on Wednesday to see if any leakage. If no leakage, can start liquids; agree that she will probably never progress past liquids. If there is leakage, will need to formally involve GI at [facility] vs discussion again with Dr. Gala Romney as to next steps to follow. Continue zosyn for 5-7 days.  Code Status: DNR Family Communication: Daughter Terri at bedside  Disposition Plan: Home with hospice hopefully in 2-3 days once we can confirm no leakage from esophageal stent.   Consultants:  GI  Palliative care  CVTS   Antibiotics:  Zosyn   Subjective: Conversing with daughter and son at bedside. No current complaints. States she had some "stabbing" CP earlier today that resolved with dilaudid. Objective: Filed Vitals:   09/23/14 2030 09/24/14 0102 09/24/14 0500 09/24/14 0757  BP:  146/67 176/61 177/63  Pulse: 82 90 92 89  Temp:  97.7 F (36.5 C) 99.9 F (37.7 C) 98.3 F (36.8 C)  TempSrc:    Oral  Resp: 18 13 12 14   Height:      Weight:   47.31 kg (104 lb 4.8 oz)   SpO2:  94% 94% 95%    Intake/Output Summary (Last 24 hours) at 09/24/14 1554 Last data filed at 09/24/14 0500  Gross per 24 hour  Intake    450 ml  Output    600 ml  Net   -150 ml   Filed Weights   09/21/14 2152 09/23/14 0400 09/24/14 0500  Weight: 47.718 kg (105 lb 3.2 oz) 48.036 kg (105 lb 14.4 oz) 47.31 kg (104 lb 4.8 oz)    Exam:   General:  Awake, pleasant  Cardiovascular: Deferred  Respiratory: Deferred  Abdomen: Deferred  Extremities: Deferred   Neurologic:  Deferred  Data Reviewed: Basic Metabolic Panel: No  results for input(s): NA, K, CL, CO2, GLUCOSE, BUN, CREATININE, CALCIUM, MG, PHOS in the last 168 hours. Liver Function Tests: No results for input(s): AST, ALT, ALKPHOS, BILITOT, PROT, ALBUMIN in the last 168 hours. No results for input(s): LIPASE, AMYLASE in the last 168 hours. No results for input(s): AMMONIA in the last 168 hours. CBC: No results for input(s): WBC, NEUTROABS, HGB, HCT, MCV, PLT in the last 168 hours. Cardiac Enzymes: No results for input(s): CKTOTAL, CKMB, CKMBINDEX, TROPONINI in the last 168 hours. BNP (last 3 results) No results for input(s): BNP in the last 8760 hours.  ProBNP (last 3 results) No results for input(s): PROBNP in the last 8760 hours.  CBG: No results for input(s): GLUCAP in the last 168 hours.  Recent Results (from the past 240 hour(s))  MRSA PCR Screening     Status: None   Collection Time: 09/21/14  9:55 PM  Result Value Ref Range Status   MRSA by PCR NEGATIVE NEGATIVE Final    Comment:        The GeneXpert MRSA Assay (FDA approved for NASAL specimens only), is one component of a comprehensive MRSA colonization surveillance program. It is not intended to diagnose MRSA infection nor to guide or monitor treatment for MRSA infections.      Studies: Dg Chest 2 View  09/24/2014   CLINICAL DATA:  Shortness of  breath.  Esophageal perforation.  EXAM: CHEST  2 VIEW  COMPARISON:  09/21/2014.  CT 09/18/2014.  FINDINGS: Mediastinum and hilar structures are normal. Prior CABG. Heart size normal. Normal pulmonary vascularity . Esophageal stent noted. Low lung volumes with mild bibasilar atelectasis and/or infiltrates. Small bilateral pleural effusions. No pneumothorax. No acute bony abnormality.  IMPRESSION: 1. Esophageal stent in stable position. 2. Mild bibasilar subsegmental atelectasis and/or infiltrates with small bilateral pleural effusions. 3. Prior CABG.  No cardiomegaly .   Electronically Signed   By: Marcello Moores  Register   On: 09/24/2014 07:30     Scheduled Meds: . antiseptic oral rinse  7 mL Mouth Rinse q12n4p  . chlorhexidine  15 mL Mouth Rinse BID  . piperacillin-tazobactam (ZOSYN)  IV  3.375 g Intravenous 3 times per day   Continuous Infusions: . sodium chloride 100 mL/hr at 09/24/14 3009    Active Problems:   Essential hypertension   Coronary atherosclerosis   Esophageal stricture   Esophageal neoplasm   Esophageal perforation   Palliative care encounter   Nausea without vomiting    Time spent: 15 minutes. Mainly spent discussing with daughter at bedside.   Lelon Frohlich  [facility] Pager [phone]  If 7PM-7AM, please contact night-coverage at [email or web address], password [facility] 09/24/2014, 3:54 PM  LOS: 3 days

## 2014-09-24 NOTE — Care Management Important Message (Signed)
Important Message  Patient Details  Name: Colleen Roberts MRN: [id number] Date of Birth: 12/29/1918   Medicare Important Message Given:  Yes-second notification given    Pricilla Handler 09/24/2014, 3:13 PM

## 2014-09-25 ENCOUNTER — Encounter (HOSPITAL_COMMUNITY): Payer: Self-pay | Admitting: Internal Medicine

## 2014-09-25 MED ORDER — LORAZEPAM 2 MG/ML IJ SOLN
0.5000 mg | Freq: Once | INTRAMUSCULAR | Status: AC
  Administered 2014-09-25: 0.5 mg via INTRAVENOUS
  Filled 2014-09-25: qty 1

## 2014-09-25 NOTE — Progress Notes (Signed)
Patient ID: Colleen Roberts, female   DOB: March 03, 1919, 81 y.o.   MRN: [id number]      [address]       [place] [address]             [phone]                 4 Days Post-Op Procedure(s) (LRB): ESOPHAGOGASTRODUODENOSCOPY (EGD) (N/A)  LOS: 4 days   Subjective: Patient very sedated from ativan during the night.  But wants a cup of coffee.  Objective: Vital signs in last 24 hours: Patient Vitals for the past 24 hrs:  BP Temp Temp src Pulse Resp SpO2 Weight  09/25/14 1108 (!) 166/70 mmHg 98.3 F (36.8 C) Oral - - 97 % -  09/25/14 0752 (!) 167/69 mmHg 97.8 F (36.6 C) Oral - 15 99 % -  09/25/14 0656 138/60 mmHg - - - - - -  09/25/14 0430 (!) 170/63 mmHg 97.8 F (36.6 C) - (!) 104 16 100 % 108 lb 14.4 oz (49.397 kg)  09/25/14 0252 - - - - - 96 % -  09/25/14 0135 (!) 142/58 mmHg - - - - - -  09/25/14 0105 (!) 177/65 mmHg 98.2 F (36.8 C) - 97 17 94 % -  09/24/14 2256 (!) 152/66 mmHg - - - - - -  09/24/14 2000 (!) 174/65 mmHg 99.1 F (37.3 C) - 85 15 95 % -  09/24/14 1628 (!) 166/50 mmHg 98.4 F (36.9 C) Oral 77 16 95 % -    Filed Weights   09/23/14 0400 09/24/14 0500 09/25/14 0430  Weight: 105 lb 14.4 oz (48.036 kg) 104 lb 4.8 oz (47.31 kg) 108 lb 14.4 oz (49.397 kg)    Hemodynamic parameters for last 24 hours:    Intake/Output from previous day: 07/18 0701 - 07/19 0700 In: 2990.7 [I.V.:2990.7] Out: 750 [Urine:750] Intake/Output this shift: Total I/O In: -  Out: 300 [Urine:300]  Scheduled Meds: . antiseptic oral rinse  7 mL Mouth Rinse q12n4p  . chlorhexidine  15 mL Mouth Rinse BID  . piperacillin-tazobactam (ZOSYN)  IV  3.375 g Intravenous 3 times per day   Continuous Infusions: . sodium chloride 100 mL/hr at 09/25/14 0400   PRN Meds:.fentaNYL (SUBLIMAZE) injection, haloperidol lactate, hydrALAZINE, ondansetron **OR** ondansetron (ZOFRAN) IV, promethazine  Exam Confused but follows commands,  Lungs clear bilateral No abdominal  tenderness  Lab Results: CBC:No results for input(s): WBC, HGB, HCT, PLT in the last 72 hours. BMET: No results for input(s): NA, K, CL, CO2, GLUCOSE, BUN, CREATININE, CALCIUM in the last 72 hours.  PT/INR: No results for input(s): LABPROT, INR in the last 72 hours.   Radiology Dg Chest 2 View  09/24/2014   CLINICAL DATA:  Shortness of breath.  Esophageal perforation.  EXAM: CHEST  2 VIEW  COMPARISON:  09/21/2014.  CT 09/18/2014.  FINDINGS: Mediastinum and hilar structures are normal. Prior CABG. Heart size normal. Normal pulmonary vascularity . Esophageal stent noted. Low lung volumes with mild bibasilar atelectasis and/or infiltrates. Small bilateral pleural effusions. No pneumothorax. No acute bony abnormality.  IMPRESSION: 1. Esophageal stent in stable position. 2. Mild bibasilar subsegmental atelectasis and/or infiltrates with small bilateral pleural effusions. 3. Prior CABG.  No cardiomegaly .   Electronically Signed   By: Marcello Moores  Register   On: 09/24/2014 07:30   Operative Report Proc. Date: 06/06/99 PREOPERATIVE DIAGNOSIS: Coronary artery disease with a left main obstruction. 80% left mainPOSTOPERATIVE DIAGNOSIS: Coronary artery disease with a  left main obstruction. SURGICAL PROCEDURE: Coronary artery bypass grafting x 4 with the left internal mammary to the left anterior descending coronary artery, a reverse saphenous Vein graft to the diagonal coronary artery, a reverse saphenous vein graft to the Obtuse marginal coronary artery, and a reverse saphenous vein graft to the distal Right coronary artery. SURGEON: Lilia Argue. Servando Snare, M.D.  Assessment/Plan: S/P Procedure(s) (LRB): ESOPHAGOGASTRODUODENOSCOPY (EGD) (N/A) Patient sedated from ativan but otherwise stable Chest xray shows no increasing effusion Swallow evaluation in am Previous cabg for left main disease 2001   Grace Isaac MD 09/25/2014 1:39 PM

## 2014-09-25 NOTE — Progress Notes (Signed)
Pt stated midsternal CP of 4, from 0-10. Belly breathing, stating "it makes me tired". 25 mcg of Fentanyl, pain and abnormal breathing resolved. VSS. Will continue to monitor

## 2014-09-25 NOTE — Progress Notes (Signed)
[facility] PROGRESS NOTE  Colleen Roberts ZLD:[id number] DOB: 1918-07-12 DOA: 09/21/2014 PCP: Alonza Bogus, MD  Assessment/Plan: Esophageal Perforation Esophageal Neoplasm HTN  Patient and family have elected comfort care and home hospice. Have discussed case with Dr. Gala Romney. Biopsies with ulcerating SCC. Will plan to keep NPO for 5 days after stent placement and order a gastrograffin swallow on Wednesday to see if any leakage. If no leakage, can start liquids; agree that she will probably never progress past liquids. If there is leakage, will need to formally involve GI at [facility] vs discussion again with Dr. Gala Romney as to next steps to follow. Continue zosyn for 5-7 days.  Code Status: DNR Family Communication: Son at bedside updated on plan of care. Disposition Plan: Home with hospice hopefully in 2-3 days once we can confirm no leakage from esophageal stent.   Consultants:  GI  Palliative care  CVTS   Antibiotics:  Zosyn   Subjective: Had ativan today and has been very drowsy. Still c/o of mild nausea and chest discomfort.  Objective: Filed Vitals:   09/25/14 0430 09/25/14 0656 09/25/14 0752 09/25/14 1108  BP: 170/63 138/60 167/69 166/70  Pulse: 104     Temp: 97.8 F (36.6 C)  97.8 F (36.6 C) 98.3 F (36.8 C)  TempSrc:   Oral Oral  Resp: 16  15   Height:      Weight: 49.397 kg (108 lb 14.4 oz)     SpO2: 100%  99% 97%    Intake/Output Summary (Last 24 hours) at 09/25/14 1526 Last data filed at 09/25/14 1300  Gross per 24 hour  Intake 1946.67 ml  Output   1350 ml  Net 596.67 ml   Filed Weights   09/23/14 0400 09/24/14 0500 09/25/14 0430  Weight: 48.036 kg (105 lb 14.4 oz) 47.31 kg (104 lb 4.8 oz) 49.397 kg (108 lb 14.4 oz)    Exam:   General:  Drowsy, slurring her speech  Cardiovascular: Deferred  Respiratory: Deferred  Abdomen: Deferred  Extremities: Deferred   Neurologic:  Deferred  Data Reviewed: Basic Metabolic Panel: No  results for input(s): NA, K, CL, CO2, GLUCOSE, BUN, CREATININE, CALCIUM, MG, PHOS in the last 168 hours. Liver Function Tests: No results for input(s): AST, ALT, ALKPHOS, BILITOT, PROT, ALBUMIN in the last 168 hours. No results for input(s): LIPASE, AMYLASE in the last 168 hours. No results for input(s): AMMONIA in the last 168 hours. CBC: No results for input(s): WBC, NEUTROABS, HGB, HCT, MCV, PLT in the last 168 hours. Cardiac Enzymes: No results for input(s): CKTOTAL, CKMB, CKMBINDEX, TROPONINI in the last 168 hours. BNP (last 3 results) No results for input(s): BNP in the last 8760 hours.  ProBNP (last 3 results) No results for input(s): PROBNP in the last 8760 hours.  CBG: No results for input(s): GLUCAP in the last 168 hours.  Recent Results (from the past 240 hour(s))  MRSA PCR Screening     Status: None   Collection Time: 09/21/14  9:55 PM  Result Value Ref Range Status   MRSA by PCR NEGATIVE NEGATIVE Final    Comment:        The GeneXpert MRSA Assay (FDA approved for NASAL specimens only), is one component of a comprehensive MRSA colonization surveillance program. It is not intended to diagnose MRSA infection nor to guide or monitor treatment for MRSA infections.      Studies: Dg Chest 2 View  09/24/2014   CLINICAL DATA:  Shortness of breath.  Esophageal  perforation.  EXAM: CHEST  2 VIEW  COMPARISON:  09/21/2014.  CT 09/18/2014.  FINDINGS: Mediastinum and hilar structures are normal. Prior CABG. Heart size normal. Normal pulmonary vascularity . Esophageal stent noted. Low lung volumes with mild bibasilar atelectasis and/or infiltrates. Small bilateral pleural effusions. No pneumothorax. No acute bony abnormality.  IMPRESSION: 1. Esophageal stent in stable position. 2. Mild bibasilar subsegmental atelectasis and/or infiltrates with small bilateral pleural effusions. 3. Prior CABG.  No cardiomegaly .   Electronically Signed   By: Marcello Moores  Register   On: 09/24/2014 07:30     Scheduled Meds: . antiseptic oral rinse  7 mL Mouth Rinse q12n4p  . chlorhexidine  15 mL Mouth Rinse BID  . piperacillin-tazobactam (ZOSYN)  IV  3.375 g Intravenous 3 times per day   Continuous Infusions: . sodium chloride 100 mL/hr at 09/25/14 0400    Active Problems:   Essential hypertension   Coronary atherosclerosis   Esophageal stricture   Esophageal neoplasm   Esophageal perforation   Palliative care encounter   Nausea without vomiting    Time spent: 15 minutes. Mainly spent discussing with daughter at bedside.   Lelon Frohlich  [facility] Pager [phone]  If 7PM-7AM, please contact night-coverage at [email or web address], password [facility] 09/25/2014, 3:26 PM  LOS: 4 days

## 2014-09-25 NOTE — Progress Notes (Signed)
I appreciate the help of Drs. Roxy Manns and Hernandezher with this nice lady.  I discussed the case with Drs. Jerilee Hoh and Dr. Saralyn Pilar today. 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  Chest x-ray from yesterday looked good with the stent in the appropriate position.  I have discussed the biopsy results via telephone the patient's granddaughter, Caren Griffins, today. Agree with plans for a Gastrografin swallow tomorrow.

## 2014-09-25 NOTE — Progress Notes (Signed)
Patient appears short of breath with labored breathing. Lungs sound clear and O2 sat on room air 96%. Notified K. Shorr with [facility] and 0.5 mg Ativan ordered. Will continue to monitor this patient closely.

## 2014-09-26 ENCOUNTER — Inpatient Hospital Stay (HOSPITAL_COMMUNITY): Payer: Medicare Other

## 2014-09-26 DIAGNOSIS — D379 Neoplasm of uncertain behavior of digestive organ, unspecified: Secondary | ICD-10-CM

## 2014-09-26 DIAGNOSIS — K223 Perforation of esophagus: Secondary | ICD-10-CM

## 2014-09-26 DIAGNOSIS — R110 Nausea: Secondary | ICD-10-CM

## 2014-09-26 DIAGNOSIS — I10 Essential (primary) hypertension: Secondary | ICD-10-CM

## 2014-09-26 DIAGNOSIS — K222 Esophageal obstruction: Secondary | ICD-10-CM

## 2014-09-26 DIAGNOSIS — Z515 Encounter for palliative care: Secondary | ICD-10-CM

## 2014-09-26 LAB — BASIC METABOLIC PANEL
Anion gap: 14 (ref 5–15)
Anion gap: 11 (ref 5–15)
BUN: 10 mg/dL (ref 6–20)
BUN: 13 mg/dL (ref 6–20)
CALCIUM: 7.6 mg/dL — AB (ref 8.9–10.3)
CHLORIDE: 100 mmol/L — AB (ref 101–111)
CO2: 22 mmol/L (ref 22–32)
CO2: 21 mmol/L — ABNORMAL LOW (ref 22–32)
CREATININE: 0.63 mg/dL (ref 0.44–1.00)
CREATININE: 0.76 mg/dL (ref 0.44–1.00)
Calcium: 7.3 mg/dL — ABNORMAL LOW (ref 8.9–10.3)
Chloride: 99 mmol/L — ABNORMAL LOW (ref 101–111)
GFR calc Af Amer: >60 mL/min (ref >60)
GFR calc non Af Amer: >60 mL/min (ref >60)
GLUCOSE: 137 mg/dL — AB (ref 65–99)
GLUCOSE: 192 mg/dL — AB (ref 65–99)
Potassium: <2 mmol/L — CL (ref 3.5–5.1)
Potassium: 3.4 mmol/L — ABNORMAL LOW (ref 3.5–5.1)
SODIUM: 135 mmol/L (ref 135–145)
Sodium: 132 mmol/L — ABNORMAL LOW (ref 135–145)

## 2014-09-26 LAB — MAGNESIUM: Magnesium: 2.2 mg/dL (ref 1.7–2.4)

## 2014-09-26 MED ORDER — POTASSIUM CHLORIDE 10 MEQ/100ML IV SOLN
10.0000 meq | INTRAVENOUS | Status: AC
  Administered 2014-09-26 (×6): 10 meq via INTRAVENOUS
  Filled 2014-09-26 (×6): qty 100

## 2014-09-26 MED ORDER — SODIUM CHLORIDE 0.9 % IV SOLN
INTRAVENOUS | Status: DC
  Administered 2014-09-26: 1000 mL via INTRAVENOUS

## 2014-09-26 MED ORDER — POTASSIUM CHLORIDE 10 MEQ/100ML IV SOLN
10.0000 meq | INTRAVENOUS | Status: AC
  Administered 2014-09-26 (×5): 10 meq via INTRAVENOUS
  Filled 2014-09-26 (×5): qty 100

## 2014-09-26 MED ORDER — IOHEXOL 300 MG/ML  SOLN
150.0000 mL | Freq: Once | INTRAMUSCULAR | Status: AC | PRN
  Administered 2014-09-26: 150 mL via ORAL

## 2014-09-26 NOTE — Progress Notes (Signed)
CRITICAL VALUE ALERT  Critical value received: K+ <2.0  Date of notification:  7.20.16   Time of notification:  0410  Critical value read back:yes  Nurse who received alert:  Gevena Barre, RN  MD notified (1st page):  Chaney Malling, NP  Time of first page:  [phone]  MD notified (2nd page):  Time of second page:  Responding MD  Lamar Blinks, NP  Time MD responded: [phone]

## 2014-09-26 NOTE — Progress Notes (Signed)
[facility] PROGRESS NOTE Assessment/Plan: Esophageal neoplasm Esophageal perforation Palliative care encounter Nausea without vomiting Esophageal stricture Coronary atherosclerosis Essential hypertension  Patient and family had elected comfort care and home hospice, it was discussed with Dr. Buford Dresser biopsies of the ulcer showed squamous cell cancer. Gastrografin swallow evaluation ,Showed no leakage resume clear liquid diet.  continue IV Zosyn awaiting CTS recommendation of antibiotic duration.  Code Status: DNR Family Communication: Son at bedside updated on plan of care. Disposition Plan: Home     Consultants:  CVTS  PMT  GI  Procedures:  zosyn  Antibiotics:  Zosyn  HPI/Subjective: She get spasms  Objective: Filed Vitals:   09/25/14 1108 09/25/14 2004 09/26/14 0527 09/26/14 0628  BP: 166/70 158/79 198/67 160/66  Pulse:  104 103 88  Temp: 98.3 F (36.8 C) 98 F (36.7 C) 98.2 F (36.8 C)   TempSrc: Oral Oral Oral   Resp:  16 16   Height:      Weight:   53.797 kg (118 lb 9.6 oz)   SpO2: 97% 99% 97%     Intake/Output Summary (Last 24 hours) at 09/26/14 1305 Last data filed at 09/26/14 1136  Gross per 24 hour  Intake 3347.5 ml  Output    250 ml  Net 3097.5 ml   Filed Weights   09/24/14 0500 09/25/14 0430 09/26/14 0527  Weight: 47.31 kg (104 lb 4.8 oz) 49.397 kg (108 lb 14.4 oz) 53.797 kg (118 lb 9.6 oz)    Exam:  General: Alert, awake, oriented x3, in no acute distress.  HEENT: No bruits, no goiter.  Heart: Regular rate and rhythm. Lungs: Good air movement, clear  Abdomen: Soft, nontender, nondistended, positive bowel sounds.  Neuro: Grossly intact, nonfocal.   Data Reviewed: Basic Metabolic Panel:  Recent Labs Lab 09/26/14 0234 09/26/14 1046  NA 135  --   K <2.0*  --   CL 99*  --   CO2 22  --   GLUCOSE 137*  --   BUN 10  --   CREATININE 0.63  --   CALCIUM 7.3*  --   MG  --  2.2   Liver Function Tests: No results  for input(s): AST, ALT, ALKPHOS, BILITOT, PROT, ALBUMIN in the last 168 hours. No results for input(s): LIPASE, AMYLASE in the last 168 hours. No results for input(s): AMMONIA in the last 168 hours. CBC: No results for input(s): WBC, NEUTROABS, HGB, HCT, MCV, PLT in the last 168 hours. Cardiac Enzymes: No results for input(s): CKTOTAL, CKMB, CKMBINDEX, TROPONINI in the last 168 hours. BNP (last 3 results) No results for input(s): BNP in the last 8760 hours.  ProBNP (last 3 results) No results for input(s): PROBNP in the last 8760 hours.  CBG: No results for input(s): GLUCAP in the last 168 hours.  Recent Results (from the past 240 hour(s))  MRSA PCR Screening     Status: None   Collection Time: 09/21/14  9:55 PM  Result Value Ref Range Status   MRSA by PCR NEGATIVE NEGATIVE Final    Comment:        The GeneXpert MRSA Assay (FDA approved for NASAL specimens only), is one component of a comprehensive MRSA colonization surveillance program. It is not intended to diagnose MRSA infection nor to guide or monitor treatment for MRSA infections.      Studies: Dg Esophagus W/water Sol Cm  09/26/2014   CLINICAL DATA:  81 year old with recent esophageal stent placement for esophageal carcinoma with esophageal perforation during endoscopy  09/21/2014.  EXAM: ESOPHOGRAM/BARIUM SWALLOW  TECHNIQUE: Single contrast examination was performed using 150 ml Omnipaque 300.  FLUOROSCOPY TIME:  Radiation Exposure Index (as provided by the fluoroscopic device):  If the device does not provide the exposure index:  Fluoroscopy Time:  35 seconds  Number of Acquired Images: 9 non fluoro store images in a single series.  COMPARISON:  Radiographs 09/24/2014. Chest CT 09/18/2014 and esophagram 09/14/2014.  FINDINGS: The patient swallowed the contrast without difficulty. The motility is within normal limits. Long segment distal esophageal stent traverses the irregular stricture previously demonstrated. The  esophageal lumen is widely patent at the level of the stent. No mucosal ulceration or extravasation identified. The contrast flows into a small hiatal hernia and subsequently the stomach. The patient's known Zenker's diverticulum was not evaluated by this examination.  IMPRESSION: Status post esophageal stenting. No evidence of perforation or residual mucosal ulceration.   Electronically Signed   By: Richardean Sale M.D.   On: 09/26/2014 08:43    Scheduled Meds: . antiseptic oral rinse  7 mL Mouth Rinse q12n4p  . chlorhexidine  15 mL Mouth Rinse BID  . piperacillin-tazobactam (ZOSYN)  IV  3.375 g Intravenous 3 times per day  . potassium chloride  10 mEq Intravenous Q1 Hr x 5   Continuous Infusions: . sodium chloride Stopped (09/26/14 0441)  . sodium chloride 1,000 mL (09/26/14 0440)    Time Spent: 25 min   Charlynne Cousins  [facility] Pager [phone]. If 7PM-7AM, please contact night-coverage at [email or web address], password [facility] 09/26/2014, 1:05 PM  LOS: 5 days

## 2014-09-26 NOTE — Progress Notes (Signed)
Daughter is at bedside and feels that IV fluids should be stopped or decreased as she feels her mother ( the patient) is becoming overloaded, I have IV fluids at [facility] at this time and I notified K. Schor of this concern.

## 2014-09-26 NOTE — Progress Notes (Signed)
Gastrografin swallow findings reviewed. No extravasation of contrast. Stent patent. Patient has been advanced to a clear liquid diet. Anticipate advancing slowly over the next day or 2 to a full liquid diet. Patient needs to absolutely avoid solids such as meat and bulky bread. Discontinue Fosamax. Oral medications via the elixir form as much as possible.  I discussed the swallow findings with patient's daughter, Siyana Erney, via telephone. It will be important for patient to stay upright for 30 minutes after she finishes her meals and take a good 30 minutes to consume them along with having adequate liquids on hand to assist with swallowing.  Hypokalemia being addressed. Hopefully, patient can go home in the near future.

## 2014-09-27 ENCOUNTER — Telehealth: Payer: Self-pay | Admitting: General Practice

## 2014-09-27 LAB — BASIC METABOLIC PANEL
Anion gap: 10 (ref 5–15)
BUN: 13 mg/dL (ref 6–20)
CALCIUM: 8.3 mg/dL — AB (ref 8.9–10.3)
CO2: 24 mmol/L (ref 22–32)
CREATININE: 0.69 mg/dL (ref 0.44–1.00)
Chloride: 102 mmol/L (ref 101–111)
Glucose, Bld: 199 mg/dL — ABNORMAL HIGH (ref 65–99)
Potassium: 3.1 mmol/L — ABNORMAL LOW (ref 3.5–5.1)
SODIUM: 136 mmol/L (ref 135–145)

## 2014-09-27 MED ORDER — POTASSIUM CHLORIDE 10 MEQ/100ML IV SOLN
10.0000 meq | INTRAVENOUS | Status: AC
  Administered 2014-09-27 (×5): 10 meq via INTRAVENOUS
  Filled 2014-09-27 (×5): qty 100

## 2014-09-27 MED ORDER — AMOXICILLIN-POT CLAVULANATE 600-42.9 MG/5ML PO SUSR
875.0000 mg | Freq: Two times a day (BID) | ORAL | Status: DC
  Filled 2014-09-27 (×2): qty 7.3

## 2014-09-27 MED ORDER — AMOXICILLIN-POT CLAVULANATE 400-57 MG/5ML PO SUSR
875.0000 mg | Freq: Two times a day (BID) | ORAL | Status: AC
Start: 2014-09-27 — End: 2014-09-27
  Administered 2014-09-27: 875 mg via ORAL
  Filled 2014-09-27 (×2): qty 10.9

## 2014-09-27 MED ORDER — AMOXICILLIN-POT CLAVULANATE 600-42.9 MG/5ML PO SUSR: 875.0000 mg | Freq: Two times a day (BID) | ORAL | Status: AC

## 2014-09-27 MED ORDER — VALSARTAN 160 MG PO TABS: 160.0000 mg | ORAL_TABLET | Freq: Every day | ORAL | Status: DC

## 2014-09-27 MED ORDER — ONDANSETRON 8 MG PO TBDP: 8.0000 mg | ORAL_TABLET | Freq: Three times a day (TID) | ORAL | Status: AC | PRN

## 2014-09-27 NOTE — Telephone Encounter (Signed)
I called and spoke with Camella, RN on [place] at [facility].  She confirmed that the patient was still there and she is going to give that patient a copy of the brochure on Esophageal Stents.  Fax number:[phone]  I will also mail a copy to the patient's home.

## 2014-09-27 NOTE — Discharge Summary (Addendum)
Physician Discharge Summary  Colleen Roberts:[id number] DOB: 01-29-1919 DOA: 09/21/2014  PCP: Alonza Bogus, MD  Admit date: 09/21/2014 Discharge date: 09/27/2014  Time spent: 65minutes  Recommendations for Outpatient Follow-up:  1. Palliative care to follow-up at home. 2. She will be off her diuretics as she is only in a clear liquid diet and she is not having adequate fluid intake.  Discharge Diagnoses:  Active Problems:   Essential hypertension   Coronary atherosclerosis   Esophageal stricture   Esophageal neoplasm   Esophageal perforation   Palliative care encounter   Nausea without vomiting   Discharge Condition: guarded  Diet recommendation: liq diet  Filed Weights   09/25/14 0430 09/26/14 0527 09/27/14 0500  Weight: 49.397 kg (108 lb 14.4 oz) 53.797 kg (118 lb 9.6 oz) 52.118 kg (114 lb 14.4 oz)    History of present illness:  81 y.o. female  This is a 81 year old lady who is post esophageal stent in the endoscopy suite. I was asked to see the patient by gastrin control G, Dr. Gala Romney. 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 Dr. Gala Romney has spoken with thoracic surgery and it is felt that the patient would be better served transferring to [facility] due to lack of gastroenterology services this weekend. Thoracic surgery did not feel that she was a surgical candidate.  Hospital Course:  Esophageal stricture due to esophageal neoplasm Esophageal perforation,essential hypertension.  She was transferred to [facility] after esophageal perforation, she was started on IV Zosyn she was treated for 6 days. EGD was performed and stent was placed. The patient was kept on nothing by mouth, thoracic surgery was consulted who recommended an EGD and stenting. Repeated  Gastrografin was done that showed no leakage, liquid diet was advanced which she tolerated well. Her diuretics were stopped and she was only started on Norvasc and ARB-II due to an elevated blood pressure. Patient and family have elected comfort care and hospice care after EGD stenting for esophageal perforation and a diagnosis of esophageal squamous cell cancer. This was discussed with Dr. Gala Romney. It was discussed with Dr. Buford Dresser and she will probably never advanced past a liquid diet.  Procedures:  Chest x-ray  Gastrografin  Consultations:  Water soluble esophagram that showed no leakage.  Discharge Exam: Filed Vitals:   09/27/14 0500  BP: 186/78  Pulse: 117  Temp: 97.8 F (36.6 C)  Resp:     General: A&O x3 Cardiovascular: RRR Respiratory: good air movement CTA B/L  Discharge Instructions   Discharge Instructions    Diet - low sodium heart healthy    Complete by:  As directed      Increase activity slowly    Complete by:  As directed           Current Discharge Medication List    START taking these medications   Details  amoxicillin-clavulanate (AUGMENTIN) 600-42.9 MG/5ML suspension Take 7.3 mLs (875 mg total) by mouth 2 times daily at 12 noon and 4 pm. Qty: 200 mL, Refills: 0    ondansetron (ZOFRAN ODT) 8 MG disintegrating tablet Take 1 tablet (8 mg total) by mouth every 8 (eight) hours as needed for nausea or vomiting. Qty: 20 tablet, Refills: 0    valsartan (DIOVAN) 160 MG tablet Take 1 tablet (160 mg total) by mouth daily. Qty:  30 tablet, Refills: 0      CONTINUE these medications which have NOT CHANGED   Details  amLODipine (NORVASC) 10 MG tablet TAKE 1 TABLET BY MOUTH ONCE A DAY. Qty: 60 tablet, Refills: 6    aspirin 81 MG chewable tablet Chew 81 mg by mouth daily.      Cyanocobalamin (VITAMIN B-12 PO) Take 1 tablet by mouth daily.    omeprazole (PRILOSEC) 20 MG capsule Take 20 mg by mouth daily.        STOP taking these medications      alendronate (FOSAMAX) 70 MG tablet      atorvastatin (LIPITOR) 20 MG tablet      bumetanide (BUMEX) 0.5 MG tablet      calcium carbonate (OS-CAL - DOSED IN MG OF ELEMENTAL CALCIUM) 1250 (500 CA) MG tablet      Cholecalciferol (VITAMIN D) 400 UNITS capsule      Coenzyme Q10 (COQ10 PO)      Homeopathic Products ([employer] LEG CRAMPS PAIN RELIEF) TABS      Magnesium 250 MG TABS      Multiple Vitamins-Minerals (MULTIVITAMIN WITH MINERALS) tablet      potassium chloride SA (K-DUR,KLOR-CON) 20 MEQ tablet      valsartan-hydrochlorothiazide (DIOVAN HCT) 320-25 MG per tablet        Allergies  Allergen Reactions  . Morphine Other (See Comments)    Nearly died per patient      The results of significant diagnostics from this hospitalization (including imaging, microbiology, ancillary and laboratory) are listed below for reference.    Significant Diagnostic Studies: Dg Chest 2 View  09/24/2014   CLINICAL DATA:  Shortness of breath.  Esophageal perforation.  EXAM: CHEST  2 VIEW  COMPARISON:  09/21/2014.  CT 09/18/2014.  FINDINGS: Mediastinum and hilar structures are normal. Prior CABG. Heart size normal. Normal pulmonary vascularity . Esophageal stent noted. Low lung volumes with mild bibasilar atelectasis and/or infiltrates. Small bilateral pleural effusions. No pneumothorax. No acute bony abnormality.  IMPRESSION: 1. Esophageal stent in stable position. 2. Mild bibasilar subsegmental atelectasis and/or infiltrates with small bilateral pleural effusions. 3. Prior CABG.  No cardiomegaly .   Electronically Signed   By: Marcello Moores  Register   On: 09/24/2014 07:30   Ct Chest W Contrast  09/18/2014   CLINICAL DATA:  Abnormal esophagram July 8th. Facial liquid diet. Chronic cough and difficulty swallowing.  EXAM: CT CHEST WITH CONTRAST  TECHNIQUE: Multidetector CT imaging of the chest was performed during intravenous contrast administration.  CONTRAST:  35mL OMNIPAQUE IOHEXOL 300 MG/ML  SOLN  COMPARISON:   Esophagram 09/14/2014, CT thorax 03/23/2013  FINDINGS: Mediastinum/Nodes: No axillary or supraclavicular lymphadenopathy. No mediastinal hilar lymphadenopathy. No pericardial fluid.  There is circumferential thickening of the mid esophagus at the level the carina. This thickening is asymmetric with the greater masslike thickening along the posterior wall of the esophagus. This thickening occurs over a 32 mm segment and measures 17 mm in thickness on image 48, series 6. This is suggestive of a mucosal lesion within the esophagus.  No paraesophageal lymph nodes identified.  Lungs/Pleura: There are several scattered small pulmonary nodules within left and right lungs are less than 5 mm and not changed from comparison exam. Example nodule would include 5 mm left upper lobe nodule on image 26, series 4 unchanged.  Upper abdomen: Limited view of the liver demonstrates a low-density lesion in the right hepatic lobe measuring 10 mm on image 54; this lesions unchanged from comparison CT of 05/02/2013.  Mild thickening of the left adrenal gland similar to prior. No upper abdominal lymphadenopathy.  Musculoskeletal: Degenerate changes of the spine without aggressive osseous lesion.  IMPRESSION: 1. A 3 cm mucosal / submucosal lesion in the mid esophagus is most consistent with primary esophageal neoplasm. 2. No evidence of metastatic disease. 3. Scattered sub cm pulmonary nodules the lungs are not changed from prior likely benign granuloma. 4. Low-density lesion in the right hepatic lobe likely represents benign cyst or hemangioma. These results will be called to the ordering clinician or representative by the Radiologist Assistant, and communication documented in the PACS or zVision Dashboard.   Electronically Signed   By: Suzy Bouchard M.D.   On: 09/18/2014 08:54   Dg Esophagus  09/14/2014   CLINICAL DATA:  81 year old female with dysphagia and chronic cough. Recent EGD demonstrating Zenker's diverticulum and Schatzki  ring.  EXAM: ESOPHOGRAM / BARIUM SWALLOW / BARIUM TABLET STUDY  TECHNIQUE: Combined double contrast and single contrast examination performed using effervescent crystals, thick barium liquid, and thin barium liquid. The patient was observed with fluoroscopy swallowing a 13 mm barium sulphate tablet.  FLUOROSCOPY TIME:  Fluoroscopy Time:  4 minutes and 42 seconds  Number of Acquired Images:  79  COMPARISON:  No priors.  FINDINGS: Initial rapid sequence imaging of the posterior oropharynx and proximal esophagus demonstrated normal oropharyngeal contraction, and confirm the presence of a small Zenker diverticulum.  Double contrast images subsequently demonstrated mild nodularity in portions of the distal esophageal mucosa, suggesting some chronic inflammation. A persistent narrowing of the midesophagus was noted throughout the examination, first identified on these double contrast images.  Multiple single swallow attempts were observed, which demonstrated failure to reliably propagate the primary peristaltic wave. Multiple tertiary contractions were observed. A full column esophagram demonstrated persistence of the area of narrowing in the mid esophagus, which often times had an "apple-core" appearance. A small distal Schatzki ring was noted.  Spontaneous reflux was observed during the examination, at which point the area of narrowing in the mid esophagus was particularly pronounced (series 50). A barium tablet was administered, which failed passed beyond the area of fixed narrowing in the midesophagus. Examination was terminated.  IMPRESSION: 1. Fixed narrowing in the mid esophagus. This is highly concerning for potential esophageal neoplasm. Other differential considerations include severe Barrett's metaplasia potentially with associated benign stricture, or extrinsic compression of the esophagus related to a malignant process in the mediastinum. Further evaluation with contrast enhanced chest CT is suggested at this  time to exclude the possibility of an extrinsic lesion, particularly in light of findings on recent EGD. In the absence of an explanation on this followup chest CT, repeat evaluation with endoscopy maybe warranted if clinically appropriate. 2. Small Zenker diverticulum. 3. Small distal Schatzki ring. As the barium tablet did not get to the level of the Schatzki ring, the significance of this ring could not be assessed. 4. Nonspecific esophageal motility disorder with tertiary contractions. These results were called by telephone at the time of interpretation on 09/14/2014 at 9:40 am to Dr. Manus Rudd, who verbally acknowledged these results.   Electronically Signed   By: Vinnie Langton M.D.   On: 09/14/2014 09:46   Dg Fluoro Rm 1-60 Min  09/21/2014   CLINICAL DATA:  Esophageal stent placement post esophageal perforation during EGD, fluoro time 42min 32 sec  EXAM: FLUORO RM 1-60 MIN  CONTRAST:  None applicable  FLUOROSCOPY TIME:  Fluoroscopy Time (in minutes and seconds): 3 minutes 32 seconds  Number of Acquired Images:  4  COMPARISON:  Chest CT 09/18/2014  FINDINGS: Images submitted show endoscope in place. 3 images demonstrate upper and lower portions of esophageal stent. Paper clips overlie the thorax, presumably used for reference.  IMPRESSION: Fluoroscopy utilized for esophageal stent placement.   Electronically Signed   By: Nolon Nations M.D.   On: 09/21/2014 19:39   Dg Chest Port 1 View  09/21/2014   CLINICAL DATA:  81 year old female status post esophageal stent placement. Concern for esophageal perforation during EGD.  EXAM: PORTABLE CHEST - 1 VIEW  COMPARISON:  Chest CT dated 09/18/2014  FINDINGS: Single-view of the chest demonstrate clear lungs. No pleural effusion or pneumothorax. The lung apices are not evaluated due to superimposition of the patient's mandible. Top-normal cardiac size. Median sternotomy wires and CABG surgical clips noted. An esophageal stent is noted extending from the level  of the aortic arch to the distal esophagus. No definite pneumomediastinum identified, however evaluation of the status is limited on radiograph.  IMPRESSION: Esophageal stent.  No definite pneumomediastinum.   Electronically Signed   By: Anner Crete M.D.   On: 09/21/2014 18:40   Dg Esophagus W/water Sol Cm  09/26/2014   CLINICAL DATA:  81 year old with recent esophageal stent placement for esophageal carcinoma with esophageal perforation during endoscopy 09/21/2014.  EXAM: ESOPHOGRAM/BARIUM SWALLOW  TECHNIQUE: Single contrast examination was performed using 150 ml Omnipaque 300.  FLUOROSCOPY TIME:  Radiation Exposure Index (as provided by the fluoroscopic device):  If the device does not provide the exposure index:  Fluoroscopy Time:  35 seconds  Number of Acquired Images: 9 non fluoro store images in a single series.  COMPARISON:  Radiographs 09/24/2014. Chest CT 09/18/2014 and esophagram 09/14/2014.  FINDINGS: The patient swallowed the contrast without difficulty. The motility is within normal limits. Long segment distal esophageal stent traverses the irregular stricture previously demonstrated. The esophageal lumen is widely patent at the level of the stent. No mucosal ulceration or extravasation identified. The contrast flows into a small hiatal hernia and subsequently the stomach. The patient's known Zenker's diverticulum was not evaluated by this examination.  IMPRESSION: Status post esophageal stenting. No evidence of perforation or residual mucosal ulceration.   Electronically Signed   By: Richardean Sale M.D.   On: 09/26/2014 08:43    Microbiology: Recent Results (from the past 240 hour(s))  MRSA PCR Screening     Status: None   Collection Time: 09/21/14  9:55 PM  Result Value Ref Range Status   MRSA by PCR NEGATIVE NEGATIVE Final    Comment:        The GeneXpert MRSA Assay (FDA approved for NASAL specimens only), is one component of a comprehensive MRSA colonization surveillance  program. It is not intended to diagnose MRSA infection nor to guide or monitor treatment for MRSA infections.      Labs: Basic Metabolic Panel:  Recent Labs Lab 09/26/14 0234 09/26/14 1046 09/26/14 1506 09/27/14 0847  NA 135  --  132* 136  K <2.0*  --  3.4* 3.1*  CL 99*  --  100* 102  CO2 22  --  21* 24  GLUCOSE 137*  --  192* 199*  BUN 10  --  13 13  CREATININE 0.63  --  0.76 0.69  CALCIUM 7.3*  --  7.6* 8.3*  MG  --  2.2  --   --    Liver Function Tests: No results for input(s): AST, ALT, ALKPHOS, BILITOT, PROT, ALBUMIN in the last  168 hours. No results for input(s): LIPASE, AMYLASE in the last 168 hours. No results for input(s): AMMONIA in the last 168 hours. CBC: No results for input(s): WBC, NEUTROABS, HGB, HCT, MCV, PLT in the last 168 hours. Cardiac Enzymes: No results for input(s): CKTOTAL, CKMB, CKMBINDEX, TROPONINI in the last 168 hours. BNP: BNP (last 3 results) No results for input(s): BNP in the last 8760 hours.  ProBNP (last 3 results) No results for input(s): PROBNP in the last 8760 hours.  CBG: No results for input(s): GLUCAP in the last 168 hours.     Signed:  Charlynne Cousins  [facility] 09/27/2014, 10:13 AM

## 2014-09-27 NOTE — Care Management Important Message (Signed)
Important Message  Patient Details  Name: Colleen Roberts MRN: [id number] Date of Birth: [birth year]   Medicare Important Message Given:  Yes-third notification given    Pricilla Handler 09/27/2014, 1:44 PM

## 2014-09-27 NOTE — Telephone Encounter (Signed)
This has been taken to the post office and mailed "overnight mail".

## 2014-09-27 NOTE — Telephone Encounter (Signed)
Routing to [place] to have brochure sent overnight to the patient and I also faxed a copy to the patient's nurse

## 2014-09-27 NOTE — Care Management Note (Signed)
Case Management Note  Patient Details  Name: Colleen Roberts MRN: [id number] Date of Birth: 02-Aug-1918  Subjective/Objective:  Pt admitted for Esophageal perforation. Plan for Home with [facility]. Pt has support of daughter at the bedside.                   Action/Plan:  CM did fax information to [facility] for [facility]. Pt will get DME: 3n1 and 02 supplied via [employer]. At this time pt is receiving IV Potassium. Plan will be to d/c post infusions. After Hours Number for RN to call and state pt is ready for d/c and for on call RN to visit pt is [phone]. Daytime Phone Number 8am-5pm [phone]. Per pt's daughter pt will be transported home via car. Per daughter no 77 will be needed for travel home. CM did make family aware that [facility] could deliver. Please e scribe all Rx to [facility] as well. Daughter had questions in regards to medications and if most were liquids. CM did make RN aware of daughters concerns. No further needs from CM at this time.    Expected Discharge Date:  09/28/14               Expected Discharge Plan:  Home w Hospice Care  In-House Referral:     Discharge planning Services  CM Consult  Post Acute Care Choice:  Hospice Choice offered to:  Adult Children  DME Arranged:  3-N-1 DME Agency:  [facility]  HH Arranged:  RN [place] Agency:  [facility]  Status of Service:  Completed, signed off  Medicare Important Message Given:  Yes-second notification given Date Medicare IM Given:    Medicare IM give by:    Date Additional Medicare IM Given:    Additional Medicare Important Message give by:     If discussed at [place] of Stay Meetings, dates discussed:    Additional Comments:  Bethena Roys, RN 09/27/2014, 11:16 AM

## 2014-09-27 NOTE — Progress Notes (Signed)
Pt discharged per MD order. IV and telemetry discontinued. Pt and family has received all discharge instructions. Verbalizes understanding. Pt to car via wheelchair.  Rosana Fret RN 09/27/14

## 2014-09-27 NOTE — Discharge Instructions (Signed)
Amoxicillin; Clavulanic Acid oral suspension What is this medicine? AMOXICILLIN; CLAVULANIC ACID (a mox i SILL in; KLAV yoo lan ic AS id) is a penicillin antibiotic. It is used to treat certain kinds of bacterial infections. It will not work for colds, flu, or other viral infections. This medicine may be used for other purposes; ask your health care provider or pharmacist if you have questions. COMMON BRAND NAME(S): Amoclan, Augmentin, Augmentin ES What should I tell my health care provider before I take this medicine? They need to know if you have any of these conditions: -bowel disease, like colitis -kidney disease -liver disease -mononucleosis -phenylketonuria -an unusual or allergic reaction to amoxicillin, penicillin, cephalosporin, other antibiotics, clavulanic acid, other medicines, foods, dyes, or preservatives -pregnant or trying to get pregnant -breast-feeding How should I use this medicine? Take this medicine by mouth just before a meal or snack. Follow the directions on the prescription label. Shake well before using. Use a specially marked spoon or container to measure your medicine. Ask your pharmacist if you do not have one. Household spoons are not accurate. Bottles of suspension may contain more liquid than you need to take. Follow your doctor's instructions about how much to take and for how many days to take it. Do not take more medicine than directed. But, finish all the medicine that is prescribed even if you think you are better. Talk to your pediatrician regarding the use of this medicine in children. While this drug may be prescribed for children as young as newborns for selected conditions, precautions do apply. Overdosage: If you think you have taken too much of this medicine contact a poison control center or emergency room at once. NOTE: This medicine is only for you. Do not share this medicine with others. What if I miss a dose? If you miss a dose, take it as soon as  you can. If it is almost time for your next dose, take only that dose. Do not take double or extra doses. What may interact with this medicine? -allopurinol -anticoagulants -birth control pills -methotrexate -probenecid This list may not describe all possible interactions. Give your health care provider a list of all the medicines, herbs, non-prescription drugs, or dietary supplements you use. Also tell them if you smoke, drink alcohol, or use illegal drugs. Some items may interact with your medicine. What should I watch for while using this medicine? Tell your doctor or health care professional if your symptoms do not improve. Do not treat diarrhea with over the counter products. Contact your doctor if you have diarrhea that lasts more than 2 days or if it is severe and watery. If you have diabetes, you may get a false-positive result for sugar in your urine. Check with your doctor or health care professional. Birth control pills may not work properly while you are taking this medicine. Talk to your doctor about using an extra method of birth control. What side effects may I notice from receiving this medicine? Side effects that you should report to your doctor or health care professional as soon as possible: -allergic reactions like skin rash, itching or hives, swelling of the face, lips, or tongue -breathing problems -dark urine -fever or chills, sore throat -redness, blistering, peeling or loosening of the skin, including inside the mouth -seizures -trouble passing urine or change in the amount of urine -unusual bleeding, bruising -unusually weak or tired -white patches or sores in the mouth or throat Side effects that usually do not require medical attention (  report to your doctor or health care professional if they continue or are bothersome): -diarrhea -dizziness -headache -nausea, vomiting -stomach upset -vaginal or anal irritation This list may not describe all possible side  effects. Call your doctor for medical advice about side effects. You may report side effects to FDA at 1-800-FDA-1088. Where should I keep my medicine? Keep out of the reach of children. After this medicine is mixed by your pharmacist, store it in a refrigerator. Do not freeze. Throw away any unused medicine after 10 days. NOTE: This sheet is a summary. It may not cover all possible information. If you have questions about this medicine, talk to your doctor, pharmacist, or health care provider.  2015, Elsevier/Gold Standard. (2012-07-20 12:08:42) Ondansetron tablets What is this medicine? ONDANSETRON (on DAN se tron) is used to treat nausea and vomiting caused by chemotherapy. It is also used to prevent or treat nausea and vomiting after surgery. This medicine may be used for other purposes; ask your health care provider or pharmacist if you have questions. COMMON BRAND NAME(S): Zofran What should I tell my health care provider before I take this medicine? They need to know if you have any of these conditions: -heart disease -history of irregular heartbeat -liver disease -low levels of magnesium or potassium in the blood -an unusual or allergic reaction to ondansetron, granisetron, other medicines, foods, dyes, or preservatives -pregnant or trying to get pregnant -breast-feeding How should I use this medicine? Take this medicine by mouth with a glass of water. Follow the directions on your prescription label. Take your doses at regular intervals. Do not take your medicine more often than directed. Talk to your pediatrician regarding the use of this medicine in children. Special care may be needed. Overdosage: If you think you have taken too much of this medicine contact a poison control center or emergency room at once. NOTE: This medicine is only for you. Do not share this medicine with others. What if I miss a dose? If you miss a dose, take it as soon as you can. If it is almost time for  your next dose, take only that dose. Do not take double or extra doses. What may interact with this medicine? Do not take this medicine with any of the following medications: -apomorphine -certain medicines for fungal infections like fluconazole, itraconazole, ketoconazole, posaconazole, voriconazole -cisapride -dofetilide -dronedarone -pimozide -thioridazine -ziprasidone This medicine may also interact with the following medications: -carbamazepine -certain medicines for depression, anxiety, or psychotic disturbances -fentanyl -linezolid -MAOIs like Carbex, Eldepryl, Marplan, Nardil, and Parnate -methylene blue (injected into a vein) -other medicines that prolong the QT interval (cause an abnormal heart rhythm) -phenytoin -rifampicin -tramadol This list may not describe all possible interactions. Give your health care provider a list of all the medicines, herbs, non-prescription drugs, or dietary supplements you use. Also tell them if you smoke, drink alcohol, or use illegal drugs. Some items may interact with your medicine. What should I watch for while using this medicine? Check with your doctor or health care professional right away if you have any sign of an allergic reaction. What side effects may I notice from receiving this medicine? Side effects that you should report to your doctor or health care professional as soon as possible: -allergic reactions like skin rash, itching or hives, swelling of the face, lips or tongue -breathing problems -confusion -dizziness -fast or irregular heartbeat -feeling faint or lightheaded, falls -fever and chills -loss of balance or coordination -seizures -sweating -swelling of the  hands or feet -tightness in the chest -tremors -unusually weak or tired Side effects that usually do not require medical attention (report to your doctor or health care professional if they continue or are bothersome): -constipation or  diarrhea -headache This list may not describe all possible side effects. Call your doctor for medical advice about side effects. You may report side effects to FDA at 1-800-FDA-1088. Where should I keep my medicine? Keep out of the reach of children. Store between 2 and 30 degrees C (36 and 86 degrees F). Throw away any unused medicine after the expiration date. NOTE: This sheet is a summary. It may not cover all possible information. If you have questions about this medicine, talk to your doctor, pharmacist, or health care provider.  2015, Elsevier/Gold Standard. (2012-11-30 16:27:45) Valsartan tablets What is this medicine? VALSARTAN (val SAR tan) is used to treat high blood pressure. This drug is also used to treat patients with heart failure and patients who have had a heart attack. This medicine may be used for other purposes; ask your health care provider or pharmacist if you have questions. COMMON BRAND NAME(S): Diovan What should I tell my health care provider before I take this medicine? They need to know if you have any of these conditions: -heart failure -kidney disease -liver disease -an unusual or allergic reaction to valsartan, other medicines, foods, dyes, or preservatives -pregnant or trying to get pregnant -breast-feeding How should I use this medicine? Take this medicine by mouth with a glass of water. Follow the directions on the prescription label. This medicine can be taken with or without food. Take your medicine at regular intervals. Do not take it more often than directed. Talk to your pediatrician regarding the use of this medicine in children. While this drug may be prescribed for children as young as 6 years for selected conditions, precautions do apply. Overdosage: If you think you have taken too much of this medicine contact a poison control center or emergency room at once. NOTE: This medicine is only for you. Do not share this medicine with others. What if I  miss a dose? If you miss a dose, take it as soon as you can. If it is almost time for your next dose, take only that dose. Do not take double or extra doses. What may interact with this medicine? -blood pressure medicines -lithium -diuretics, especially triamterene, spironolactone or amiloride -potassium salts or potassium supplements This list may not describe all possible interactions. Give your health care provider a list of all the medicines, herbs, non-prescription drugs, or dietary supplements you use. Also tell them if you smoke, drink alcohol, or use illegal drugs. Some items may interact with your medicine. What should I watch for while using this medicine? Visit your doctor or health care professional for regular checks on your progress. Check your blood pressure as directed. Ask your doctor or health care professional what your blood pressure should be and when you should contact him or her. Call your doctor or health care professional if you notice an irregular or fast heart beat. Women should inform their doctor if they wish to become pregnant or think they might be pregnant. There is a potential for serious side effects to an unborn child, particularly in the second or third trimester. Talk to your health care professional or pharmacist for more information. You may get drowsy or dizzy. Do not drive, use machinery, or do anything that needs mental alertness until you know how this drug  affects you. Do not stand or sit up quickly, especially if you are an older patient. This reduces the risk of dizzy or fainting spells. Alcohol can make you more drowsy and dizzy. Avoid alcoholic drinks. Avoid salt substitutes unless you are told otherwise by your doctor or health care professional. Do not treat yourself for coughs, colds, or pain while you are taking this medicine without asking your doctor or health care professional for advice. Some ingredients may increase your blood pressure. What side  effects may I notice from receiving this medicine? Side effects that you should report to your doctor or health care professional as soon as possible: -confusion, dizziness, light headedness or fainting spells -decreased amount of urine passed -difficulty breathing or swallowing, hoarseness, or tightening of the throat -fast or irregular heart beat, palpitations, or chest pain -skin rash, itching -swelling of your face, lips, tongue, hands, or feet Side effects that usually do not require medical attention (report to your doctor or health care professional if they continue or are bothersome): -cough -decreased sexual function -headache -nausea or stomach pain This list may not describe all possible side effects. Call your doctor for medical advice about side effects. You may report side effects to FDA at 1-800-FDA-1088. Where should I keep my medicine? Keep out of the reach of children. Store at room temperature between 15 and 30 degrees C (59 and 86 degrees F). Keep your medicine container tightly closed and protect from moisture. Throw away any unused medicine after the expiration date. NOTE: This sheet is a summary. It may not cover all possible information. If you have questions about this medicine, talk to your doctor, pharmacist, or health care provider.  2015, Elsevier/Gold Standard. (2012-05-26 12:39:59)

## 2014-10-15 ENCOUNTER — Telehealth: Payer: Self-pay

## 2014-10-15 NOTE — Telephone Encounter (Signed)
Tried to call pts daughter to see how pt was doing. NA and no voicemail.

## 2014-10-15 NOTE — Telephone Encounter (Signed)
Tried to call the pts home number- NA and no voicemail.

## 2014-10-15 NOTE — Telephone Encounter (Signed)
Called and spoke with Royalty Fakhouri- pts daughter [phone])- she said the pt is doing ok right now. She is eating soft foods and very small bites of food. She said she is loosing weight but they were expecting that. She is having some pain that occurs off and on from where the stent is, but they are giving her tylenol right now and that is taking care of it. They do give her an oxycodone at night sometimes. She said she appreciated RMR thinking about them.

## 2014-10-16 NOTE — Telephone Encounter (Signed)
I appreciate the follow-up. 

## 2014-10-21 ENCOUNTER — Other Ambulatory Visit (INDEPENDENT_AMBULATORY_CARE_PROVIDER_SITE_OTHER): Payer: Self-pay | Admitting: Internal Medicine

## 2014-10-21 ENCOUNTER — Encounter: Payer: Self-pay | Admitting: Internal Medicine

## 2014-10-21 DIAGNOSIS — K222 Esophageal obstruction: Secondary | ICD-10-CM

## 2014-10-21 DIAGNOSIS — C159 Malignant neoplasm of esophagus, unspecified: Secondary | ICD-10-CM

## 2014-10-21 NOTE — Progress Notes (Signed)
Patient ID: Colleen Roberts, female   DOB: March 21, 1918, 81 y.o.   MRN: [id number]   House call today.  Pt doing remarkably well - accompanied by numerous family members; tolerating a full liquid, pureed diet very well - no significant dysphagia episodes with this diet since stent placement.  She takes very low doses of oxycodone at bedtime only to facilitate sleep. She is experiencing intermitant episodes of chest pain - lasting no more than 30-45 seconds - states somewhat reminiscent of her chronic GERD sx (only on OTC prilosec on occasion).  BM's good with Miralax as needed. I recommended she go on daily acid suppression therapy in the way of Prevacid 30 mg solutabs (no substitution).  Depending on response, we could try Carafate if needed.  I don't see a need to escalate narcotic therapy at this time.  [name], RN, pts daughter, is living with her currently and is watching her closely.  She will keep [place] updated.  She has my cell phone and office number.

## 2014-10-22 ENCOUNTER — Telehealth: Payer: Self-pay | Admitting: General Practice

## 2014-10-22 NOTE — Telephone Encounter (Signed)
T/C from [place] and gave him the order for the Prevacid 30 mg solutabs. #30. Take one and let it dissolve in mouth daily with 5 refills.

## 2014-10-22 NOTE — Telephone Encounter (Signed)
See house call note. Pt needs Prevacid 30 mg solutabs - disp #30. Take one and let it dissolve in mouth daily. Disp 30 w 5 refills. I told family we would go w suspension- but that form not available,at least, in epocrates. This rx is no substitution - Call hospice and they are supposed to get med for pt for free.   Nasim Garofano, I had a little trouble putting this kind 14f note in epic. Please make sure there is NO CHARGE for this visit.        I checked and patient will not be charged for the 8/14 visit.  Routing to [place] to take care of today since Almyra Free is on [place].

## 2014-10-22 NOTE — Telephone Encounter (Signed)
I called [facility]osp[facility] @ [phone], was given the VM for the nurse, Pearson Forster. I left message for a return call in reference to the prescription for this pt.

## 2014-10-22 NOTE — Telephone Encounter (Signed)
I called back and spoke to [place] who said she will give the message to Rob and have him call me for the medication order.

## 2014-10-23 ENCOUNTER — Other Ambulatory Visit: Payer: Self-pay | Admitting: Internal Medicine

## 2014-10-23 MED ORDER — LANSOPRAZOLE 30 MG PO TBDP: 30.0000 mg | ORAL_TABLET | Freq: Every day | ORAL | Status: AC

## 2014-10-30 ENCOUNTER — Other Ambulatory Visit: Payer: Self-pay | Admitting: Cardiology

## 2014-12-08 DEATH — deceased

## 2017-04-10 IMAGING — RF DG ESOPHAGUS
9 of 15 series · 14 of 23 positions shown · IV contrast (omnipaque)
Comparison: Radiographs 09/24/2014. Chest CT 09/18/2014 and
esophagram 09/14/2014.

CLINICAL DATA: [AGE] with recent esophageal stent placement
for esophageal carcinoma with esophageal perforation during
endoscopy 09/21/2014.

EXAM:
ESOPHOGRAM/BARIUM SWALLOW
TECHNIQUE: Single contrast examination was performed using 150 ml Omnipaque
300..
FLUOROSCOPY TIME:  Radiation Exposure Index (as provided by the
fluoroscopic device):
If the device does not provide the exposure index:
Fluoroscopy Time:  35 seconds
Number of Acquired Images: 9 non fluoro store images in a single
series.

[Series 1: run · 1 of 1 slices shown (1 of 9)]
[im 1/1]
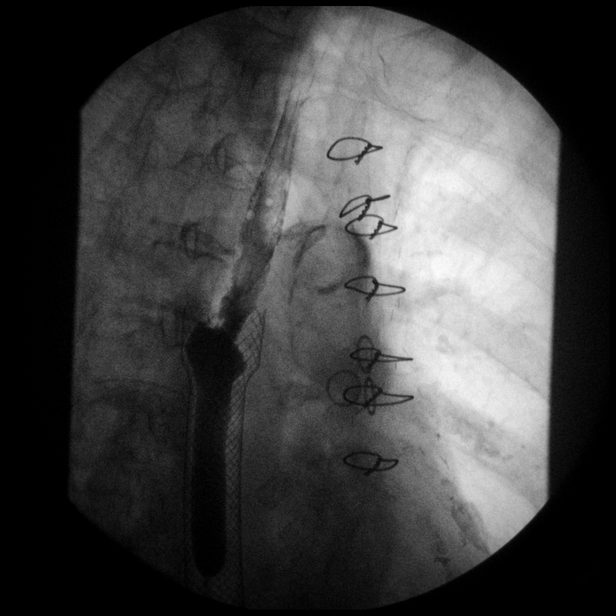

[Series 3: run · 1 of 1 slices shown (2 of 9)]
[im 1/1]
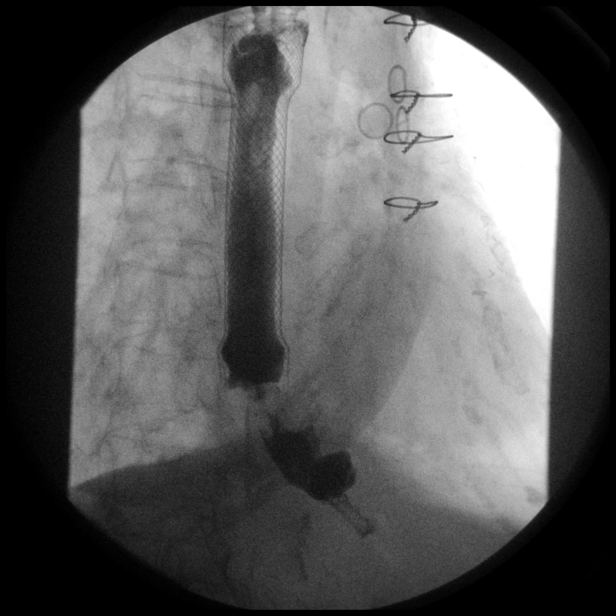

[Series 5: run · 6 of 9 slices shown (3 of 9)]
[im 1/9]
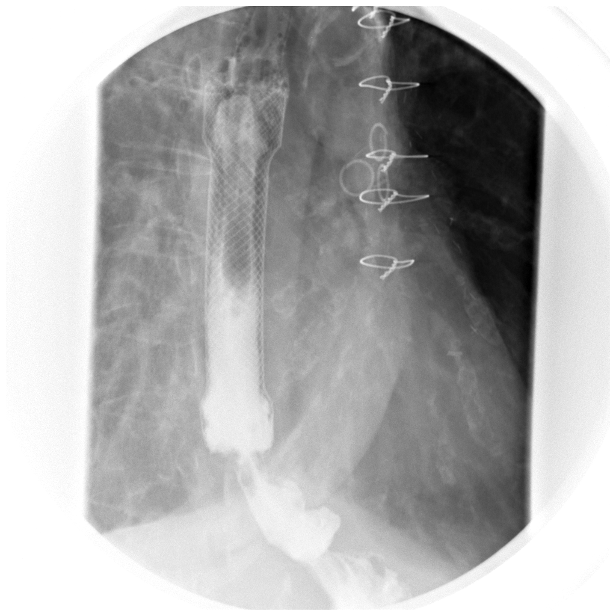
[im 2/9]
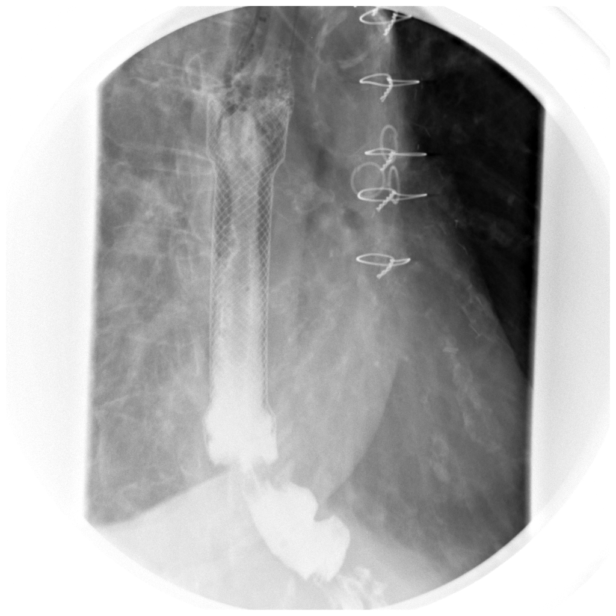
[im 4/9]
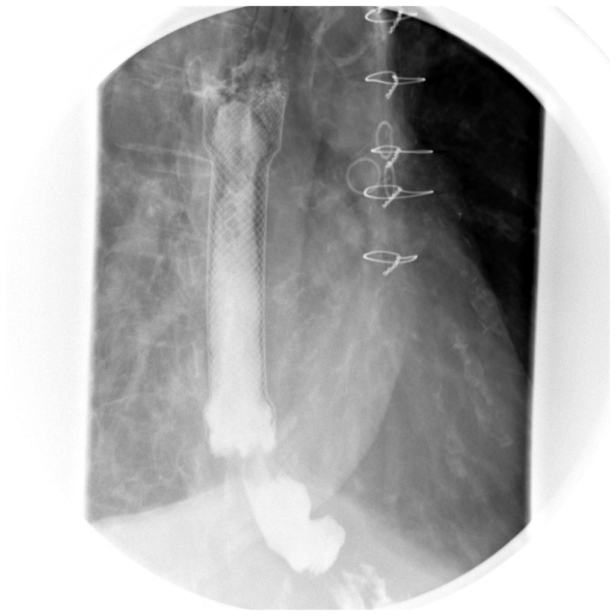
[im 6/9]
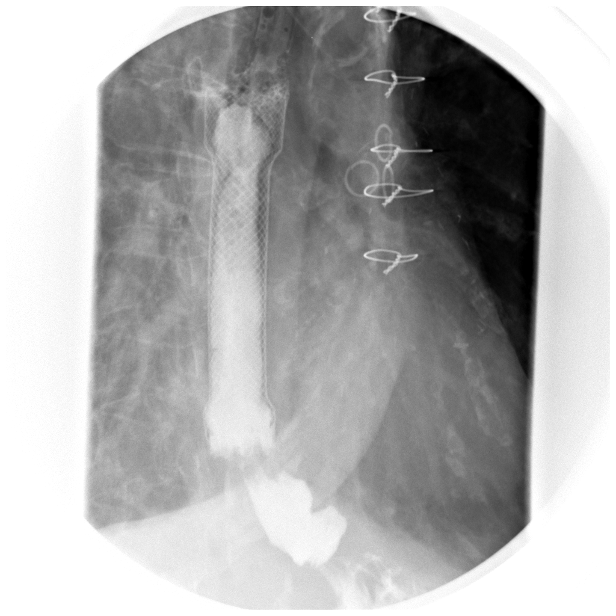
[im 7/9]
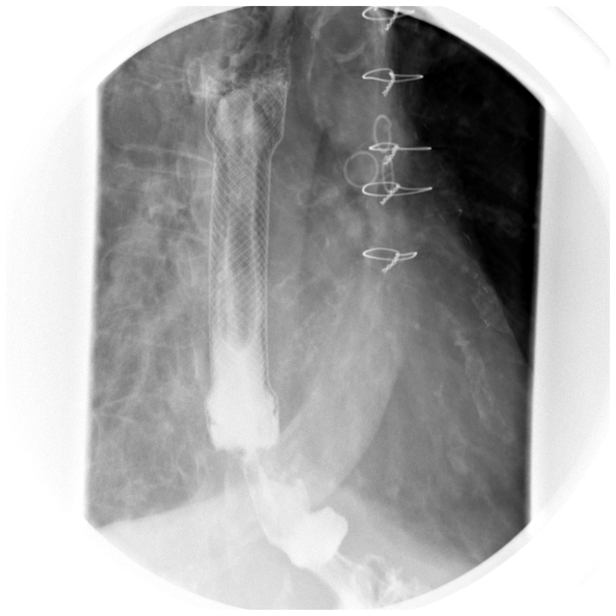
[im 9/9]
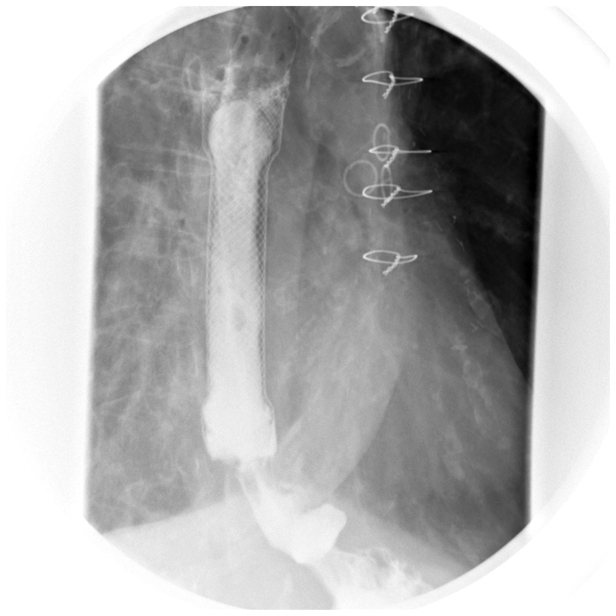

[Series 6: run · 1 of 1 slices shown (4 of 9)]
[im 1/1]
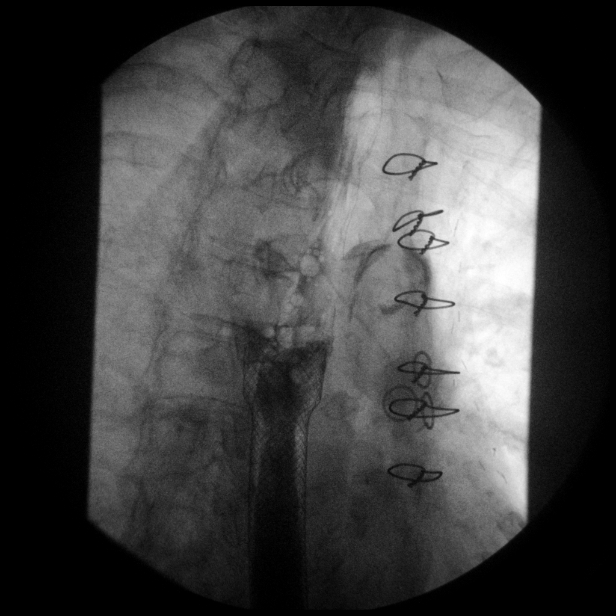

[Series 8: run · 1 of 1 slices shown (5 of 9)]
[im 1/1]
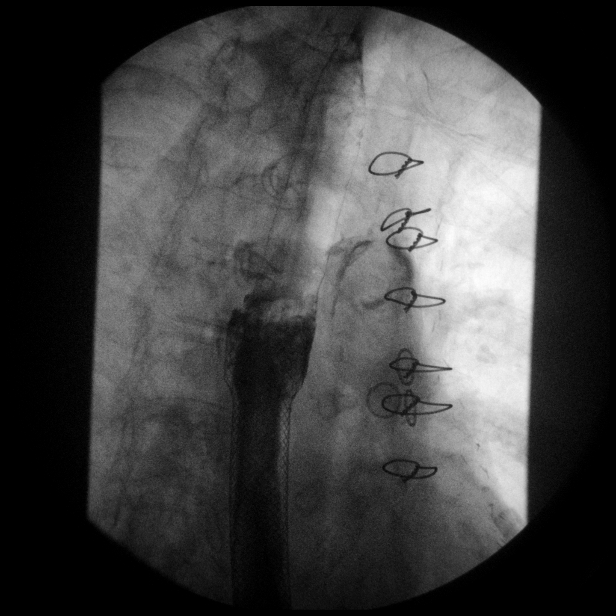

[Series 10: run · 1 of 1 slices shown (6 of 9)]
[im 1/1]
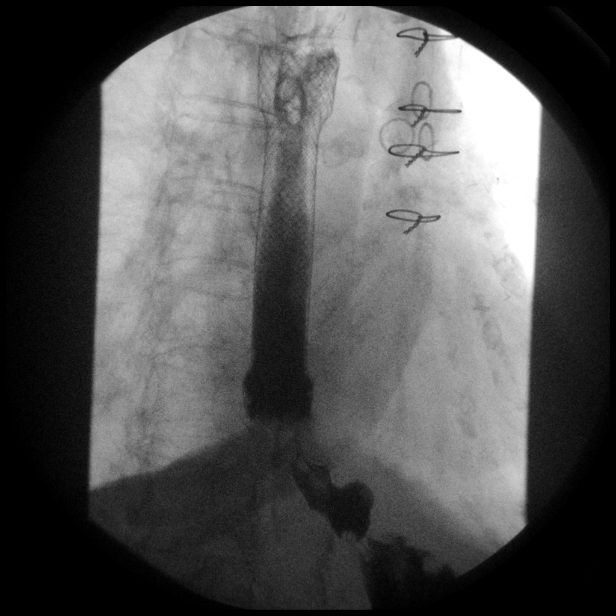

[Series 11: run · 1 of 1 slices shown (7 of 9)]
[im 1/1]
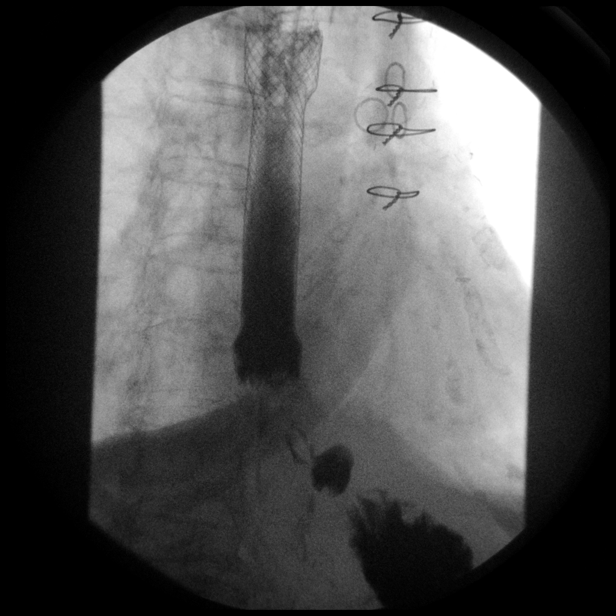

[Series 13: run · 1 of 1 slices shown (8 of 9)]
[im 1/1]
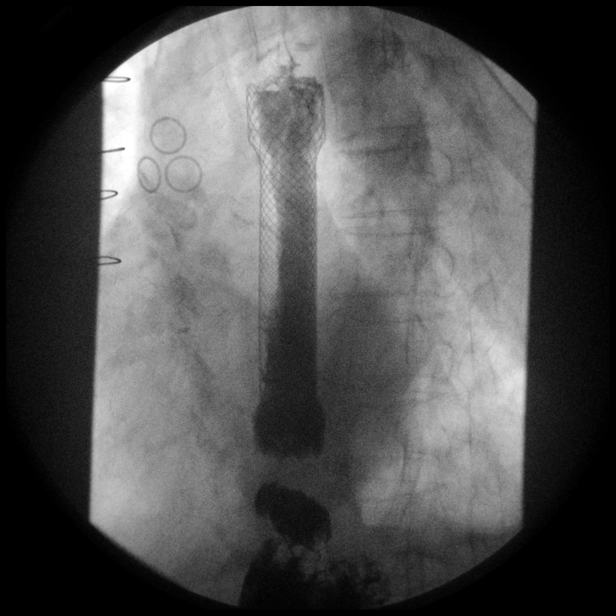

[Series 15: run · 1 of 1 slices shown (9 of 9)]
[im 1/1]
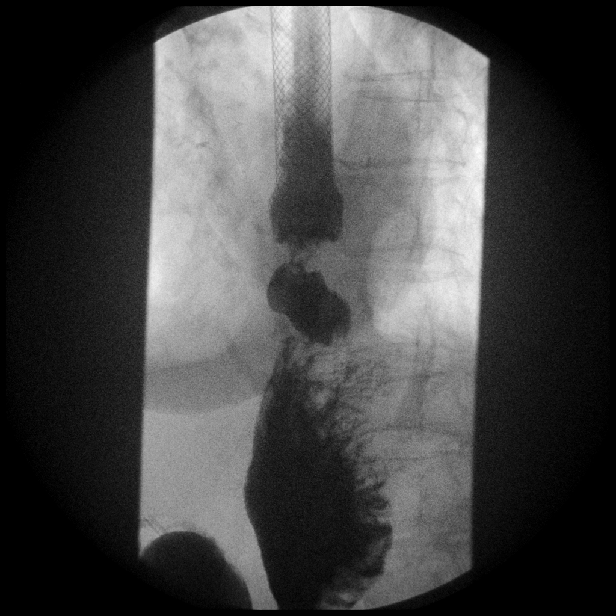

[14 of 23 positions shown; findings below may reference images not displayed]

FINDINGS: The patient swallowed the contrast without difficulty. The motility
is within normal limits. Long segment distal esophageal stent
traverses the irregular stricture previously demonstrated. The
esophageal lumen is widely patent at the level of the stent. No
mucosal ulceration or extravasation identified. The contrast flows
into a small hiatal hernia and subsequently the stomach. The
patient's known [place] diverticulum was not evaluated by this
examination.
IMPRESSION: Status post esophageal stenting. No evidence of perforation or
residual mucosal ulceration.
